# Patient Record
Sex: Female | Born: 1970
Health system: Southern US, Community
[De-identification: ages and names within clinical notes are randomized; demographics above are authoritative.]

## PROBLEM LIST (undated history)

## (undated) DIAGNOSIS — E039 Hypothyroidism, unspecified: Secondary | ICD-10-CM

## (undated) DIAGNOSIS — R87610 Atypical squamous cells of undetermined significance on cytologic smear of cervix (ASC-US): Secondary | ICD-10-CM

## (undated) DIAGNOSIS — E079 Disorder of thyroid, unspecified: Secondary | ICD-10-CM

## (undated) DIAGNOSIS — L709 Acne, unspecified: Secondary | ICD-10-CM

## (undated) DIAGNOSIS — R011 Cardiac murmur, unspecified: Secondary | ICD-10-CM

## (undated) DIAGNOSIS — N870 Mild cervical dysplasia: Secondary | ICD-10-CM

## (undated) HISTORY — PX: WISDOM TOOTH EXTRACTION: SHX21

## (undated) HISTORY — DX: Cardiac murmur, unspecified: R01.1

## (undated) HISTORY — PX: COLPOSCOPY: SHX161

## (undated) HISTORY — DX: Acne, unspecified: L70.9

## (undated) HISTORY — DX: Mild cervical dysplasia: N87.0

## (undated) HISTORY — DX: Hypothyroidism, unspecified: E03.9

## (undated) HISTORY — DX: Disorder of thyroid, unspecified: E07.9

## (undated) HISTORY — DX: Atypical squamous cells of undetermined significance on cytologic smear of cervix (ASC-US): R87.610

---

## 1992-01-10 DIAGNOSIS — N870 Mild cervical dysplasia: Secondary | ICD-10-CM

## 1992-01-10 HISTORY — DX: Mild cervical dysplasia: N87.0

## 1999-01-10 HISTORY — PX: COLPOSCOPY: SHX161

## 1999-03-09 ENCOUNTER — Other Ambulatory Visit: Admission: RE | Admit: 1999-03-09 | Discharge: 1999-03-09 | Payer: Self-pay | Admitting: Obstetrics and Gynecology

## 2000-03-12 ENCOUNTER — Other Ambulatory Visit: Admission: RE | Admit: 2000-03-12 | Discharge: 2000-03-12 | Payer: Self-pay | Admitting: Obstetrics and Gynecology

## 2000-07-06 ENCOUNTER — Ambulatory Visit (HOSPITAL_COMMUNITY): Admission: RE | Admit: 2000-07-06 | Discharge: 2000-07-06 | Payer: Self-pay | Admitting: Obstetrics and Gynecology

## 2000-07-06 ENCOUNTER — Encounter: Payer: Self-pay | Admitting: Obstetrics and Gynecology

## 2001-03-12 ENCOUNTER — Other Ambulatory Visit: Admission: RE | Admit: 2001-03-12 | Discharge: 2001-03-12 | Payer: Self-pay | Admitting: Obstetrics and Gynecology

## 2002-03-24 ENCOUNTER — Other Ambulatory Visit: Admission: RE | Admit: 2002-03-24 | Discharge: 2002-03-24 | Payer: Self-pay | Admitting: Obstetrics and Gynecology

## 2003-03-26 ENCOUNTER — Other Ambulatory Visit: Admission: RE | Admit: 2003-03-26 | Discharge: 2003-03-26 | Payer: Self-pay | Admitting: Obstetrics and Gynecology

## 2004-03-29 ENCOUNTER — Other Ambulatory Visit: Admission: RE | Admit: 2004-03-29 | Discharge: 2004-03-29 | Payer: Self-pay | Admitting: Obstetrics and Gynecology

## 2005-04-19 ENCOUNTER — Other Ambulatory Visit: Admission: RE | Admit: 2005-04-19 | Discharge: 2005-04-19 | Payer: Self-pay | Admitting: Obstetrics and Gynecology

## 2006-04-26 ENCOUNTER — Other Ambulatory Visit: Admission: RE | Admit: 2006-04-26 | Discharge: 2006-04-26 | Payer: Self-pay | Admitting: Obstetrics and Gynecology

## 2007-04-29 ENCOUNTER — Other Ambulatory Visit: Admission: RE | Admit: 2007-04-29 | Discharge: 2007-04-29 | Payer: Self-pay | Admitting: Obstetrics and Gynecology

## 2008-05-04 ENCOUNTER — Ambulatory Visit: Payer: Self-pay | Admitting: Obstetrics and Gynecology

## 2008-05-04 ENCOUNTER — Encounter: Payer: Self-pay | Admitting: Obstetrics and Gynecology

## 2008-05-04 ENCOUNTER — Other Ambulatory Visit: Admission: RE | Admit: 2008-05-04 | Discharge: 2008-05-04 | Payer: Self-pay | Admitting: Obstetrics and Gynecology

## 2009-05-14 ENCOUNTER — Ambulatory Visit: Payer: Self-pay | Admitting: Obstetrics and Gynecology

## 2009-05-14 ENCOUNTER — Other Ambulatory Visit: Admission: RE | Admit: 2009-05-14 | Discharge: 2009-05-14 | Payer: Self-pay | Admitting: Obstetrics and Gynecology

## 2010-03-15 ENCOUNTER — Other Ambulatory Visit (INDEPENDENT_AMBULATORY_CARE_PROVIDER_SITE_OTHER): Payer: Managed Care, Other (non HMO)

## 2010-03-15 DIAGNOSIS — R5383 Other fatigue: Secondary | ICD-10-CM

## 2010-03-15 DIAGNOSIS — E039 Hypothyroidism, unspecified: Secondary | ICD-10-CM

## 2010-03-15 DIAGNOSIS — R5381 Other malaise: Secondary | ICD-10-CM

## 2010-05-16 ENCOUNTER — Encounter (INDEPENDENT_AMBULATORY_CARE_PROVIDER_SITE_OTHER): Payer: Managed Care, Other (non HMO) | Admitting: Obstetrics and Gynecology

## 2010-05-16 ENCOUNTER — Other Ambulatory Visit (HOSPITAL_COMMUNITY)
Admission: RE | Admit: 2010-05-16 | Discharge: 2010-05-16 | Disposition: A | Discharge status: Home / Self Care | Payer: Managed Care, Other (non HMO) | Source: Ambulatory Visit | Attending: Obstetrics and Gynecology | Admitting: Obstetrics and Gynecology

## 2010-05-16 ENCOUNTER — Other Ambulatory Visit: Payer: Self-pay | Admitting: Obstetrics and Gynecology

## 2010-05-16 DIAGNOSIS — Z01419 Encounter for gynecological examination (general) (routine) without abnormal findings: Secondary | ICD-10-CM

## 2010-05-16 DIAGNOSIS — Z13220 Encounter for screening for lipoid disorders: Secondary | ICD-10-CM

## 2010-05-16 DIAGNOSIS — Z124 Encounter for screening for malignant neoplasm of cervix: Secondary | ICD-10-CM | POA: Insufficient documentation

## 2010-05-19 ENCOUNTER — Other Ambulatory Visit: Payer: Managed Care, Other (non HMO)

## 2010-05-19 ENCOUNTER — Ambulatory Visit (INDEPENDENT_AMBULATORY_CARE_PROVIDER_SITE_OTHER): Payer: Managed Care, Other (non HMO) | Admitting: Obstetrics and Gynecology

## 2010-05-19 DIAGNOSIS — R143 Flatulence: Secondary | ICD-10-CM

## 2010-05-19 DIAGNOSIS — N8300 Follicular cyst of ovary, unspecified side: Secondary | ICD-10-CM

## 2010-05-19 DIAGNOSIS — R141 Gas pain: Secondary | ICD-10-CM

## 2010-05-19 DIAGNOSIS — N949 Unspecified condition associated with female genital organs and menstrual cycle: Secondary | ICD-10-CM

## 2010-10-21 ENCOUNTER — Other Ambulatory Visit: Payer: Self-pay | Admitting: Obstetrics and Gynecology

## 2010-10-21 DIAGNOSIS — Z1231 Encounter for screening mammogram for malignant neoplasm of breast: Secondary | ICD-10-CM

## 2010-11-15 ENCOUNTER — Ambulatory Visit (HOSPITAL_COMMUNITY)
Admission: RE | Admit: 2010-11-15 | Discharge: 2010-11-15 | Disposition: A | Discharge status: Home / Self Care | Payer: Managed Care, Other (non HMO) | Source: Ambulatory Visit | Attending: Obstetrics and Gynecology | Admitting: Obstetrics and Gynecology

## 2010-11-15 DIAGNOSIS — Z1231 Encounter for screening mammogram for malignant neoplasm of breast: Secondary | ICD-10-CM | POA: Insufficient documentation

## 2011-05-10 ENCOUNTER — Encounter: Payer: Self-pay | Admitting: Gynecology

## 2011-05-10 DIAGNOSIS — R011 Cardiac murmur, unspecified: Secondary | ICD-10-CM | POA: Insufficient documentation

## 2011-05-23 ENCOUNTER — Encounter: Payer: Self-pay | Admitting: Obstetrics and Gynecology

## 2011-05-23 ENCOUNTER — Ambulatory Visit (INDEPENDENT_AMBULATORY_CARE_PROVIDER_SITE_OTHER): Payer: Managed Care, Other (non HMO) | Admitting: Obstetrics and Gynecology

## 2011-05-23 VITALS — BP 116/70 | Ht 64.0 in | Wt 127.0 lb

## 2011-05-23 DIAGNOSIS — Z01419 Encounter for gynecological examination (general) (routine) without abnormal findings: Secondary | ICD-10-CM

## 2011-05-23 DIAGNOSIS — E039 Hypothyroidism, unspecified: Secondary | ICD-10-CM

## 2011-05-23 MED ORDER — LEVOTHYROXINE SODIUM 125 MCG PO TABS: 125.0000 ug | ORAL_TABLET | Freq: Every day | ORAL | Status: DC

## 2011-05-23 NOTE — Progress Notes (Signed)
Patient came to see me today for her annual GYN exam. She is up-to-date on mammograms. Her cycles are fine. She continues not to use contraception and if she conceives it'll be okay. She does have a history of infertility. She is doing well on her Synthroid. She is having no abnormal bleeding. She is having no pelvic pain or dyspareunia. She did wellness lab  at work and will have it sent to [place]. 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  Physical examination: Kennon Portela present. HEENT within normal limits. Neck: Thyroid not large. No masses. Supraclavicular nodes: not enlarged. Breasts: Examined in both sitting and lying  position. No skin changes and no masses. Abdomen: Soft no guarding rebound or masses or hernia. Pelvic: External: Within normal limits. BUS: Within normal limits. Vaginal:within normal limits. Good estrogen effect. No evidence of cystocele rectocele or enterocele. Cervix: clean. Uterus: Normal size and shape. Adnexa: No masses. Rectovaginal exam: Confirmatory and negative. Extremities: Within normal limits.  Assessment: #1. Hypothyroidism #2. CIN-1  Plan: TSH checked. Continue Synthroid.

## 2011-05-24 LAB — URINALYSIS W MICROSCOPIC + REFLEX CULTURE
Bacteria, UA: NONE SEEN
Bilirubin Urine: NEGATIVE
Casts: NONE SEEN
Crystals: NONE SEEN
Glucose, UA: NEGATIVE mg/dL
Hgb urine dipstick: NEGATIVE
Ketones, ur: NEGATIVE mg/dL
Leukocytes, UA: NEGATIVE
Nitrite: NEGATIVE
Protein, ur: NEGATIVE mg/dL
Specific Gravity, Urine: 1.009 (ref 1.005–1.030)
Squamous Epithelial / HPF: NONE SEEN
Urobilinogen, UA: 0.2 mg/dL (ref 0.0–1.0)
pH: 6 (ref 5.0–8.0)

## 2012-03-21 ENCOUNTER — Other Ambulatory Visit (HOSPITAL_COMMUNITY): Payer: Self-pay | Admitting: Family Medicine

## 2012-03-21 ENCOUNTER — Other Ambulatory Visit (HOSPITAL_COMMUNITY): Payer: Managed Care, Other (non HMO)

## 2012-03-21 DIAGNOSIS — I369 Nonrheumatic tricuspid valve disorder, unspecified: Secondary | ICD-10-CM

## 2012-03-25 ENCOUNTER — Other Ambulatory Visit (HOSPITAL_COMMUNITY): Payer: Managed Care, Other (non HMO)

## 2012-04-15 ENCOUNTER — Ambulatory Visit (HOSPITAL_COMMUNITY): Payer: Managed Care, Other (non HMO) | Attending: Cardiovascular Disease

## 2012-04-15 DIAGNOSIS — I369 Nonrheumatic tricuspid valve disorder, unspecified: Secondary | ICD-10-CM | POA: Insufficient documentation

## 2012-04-15 NOTE — Progress Notes (Signed)
Echocardiogram performed.  

## 2012-04-16 ENCOUNTER — Encounter (HOSPITAL_COMMUNITY): Payer: Self-pay | Admitting: Family Medicine

## 2012-05-24 ENCOUNTER — Encounter: Payer: Self-pay | Admitting: Gynecology

## 2012-05-30 ENCOUNTER — Other Ambulatory Visit: Payer: Self-pay | Admitting: Obstetrics and Gynecology

## 2012-06-12 ENCOUNTER — Other Ambulatory Visit (HOSPITAL_COMMUNITY)
Admission: RE | Admit: 2012-06-12 | Discharge: 2012-06-12 | Disposition: A | Discharge status: Home / Self Care | Payer: Managed Care, Other (non HMO) | Source: Ambulatory Visit | Attending: Gynecology | Admitting: Gynecology

## 2012-06-12 ENCOUNTER — Encounter: Payer: Self-pay | Admitting: Gynecology

## 2012-06-12 ENCOUNTER — Encounter: Payer: Self-pay | Admitting: Obstetrics and Gynecology

## 2012-06-12 ENCOUNTER — Ambulatory Visit (INDEPENDENT_AMBULATORY_CARE_PROVIDER_SITE_OTHER): Payer: Managed Care, Other (non HMO) | Admitting: Gynecology

## 2012-06-12 VITALS — BP 114/70 | Ht 64.25 in | Wt 128.0 lb

## 2012-06-12 DIAGNOSIS — Z01419 Encounter for gynecological examination (general) (routine) without abnormal findings: Secondary | ICD-10-CM | POA: Insufficient documentation

## 2012-06-12 DIAGNOSIS — E039 Hypothyroidism, unspecified: Secondary | ICD-10-CM

## 2012-06-12 LAB — TSH: TSH: 1.103 u[IU]/mL (ref 0.350–4.500)

## 2012-06-12 MED ORDER — LEVOTHYROXINE SODIUM 125 MCG PO TABS: ORAL_TABLET | ORAL | Status: DC

## 2012-06-12 NOTE — Addendum Note (Signed)
Addended by: Richardson Chiquito on: 06/12/2012 09:53 AM   Modules accepted: Orders

## 2012-06-12 NOTE — Addendum Note (Signed)
Addended by: Bertram Savin A on: 06/12/2012 10:22 AM   Modules accepted: Orders

## 2012-06-12 NOTE — Patient Instructions (Addendum)
Call to Schedule your mammogram  Facilities in [place]: 1)  [facility], [address] Phone: [phone] 2)  [facility], [address] Phone: [phone] 3)  Dr. Bertrand at [facility]  [address] Phone: [phone]     Mammogram A mammogram is an X-ray test to find changes in a woman's breast. You should get a mammogram if:  You are 40 years of age or older  You have risk factors.   Your doctor recommends that you have one.  BEFORE THE TEST  Do not schedule the test the week before your period, especially if your breasts are sore during this time.  On the day of your mammogram:  Wash your breasts and armpits well. After washing, do not put on any deodorant or talcum powder on until after your test.   Eat and drink as you usually do.   Take your medicines as usual.   If you are diabetic and take insulin, make sure you:   Eat before coming for your test.   Take your insulin as usual.   If you cannot keep your appointment, call before the appointment to cancel. Schedule another appointment.  TEST  You will need to undress from the waist up. You will put on a hospital gown.   Your breast will be put on the mammogram machine, and it will press firmly on your breast with a piece of plastic called a compression paddle. This will make your breast flatter so that the machine can X-ray all parts of your breast.   Both breasts will be X-rayed. Each breast will be X-rayed from above and from the side. An X-ray might need to be taken again if the picture is not good enough.   The mammogram will last about 15 to 30 minutes.  AFTER THE TEST Finding out the results of your test Ask when your test results will be ready. Make sure you get your test results.  Document Released: 03/24/2008 Document Revised: 12/15/2010 Document Reviewed: 03/24/2008 Exit[employer] Patient  Information 2012 [employer].   

## 2012-06-12 NOTE — Progress Notes (Signed)
Madison [name] [facility] 12-08-1970 [id number]        42 y.o.  G0P0 for annual exam.  Former patient of Dr. Eda [name] Doing well without complaints.  Past medical history,surgical history, medications, allergies, family history and social history were all reviewed and documented in the EPIC chart.  ROS:  Performed and pertinent positives and negatives are included in the history, assessment and plan .  Exam: Madison [name] assistant Filed Vitals:   06/12/12 0914  BP: 114/70  Height: 5' 4.25" (1.632 m)  Weight: 128 lb (58.06 kg)   General appearance  Normal Skin grossly normal Head/Neck normal with no cervical or supraclavicular adenopathy thyroid normal Lungs  clear Cardiac RR, without RMG Abdominal  soft, nontender, without masses, organomegaly or hernia Breasts  examined lying and sitting without masses, retractions, discharge or axillary adenopathy. Pelvic  Ext/BUS/vagina  normal   Cervix  normal Pap done  Uterus  axial, normal size, shape and contour, midline and mobile nontender   Adnexa  Without masses or tenderness    Anus and perineum  normal   Rectovaginal  normal sphincter tone without palpated masses or tenderness.    Assessment/Plan:  42 y.o. G0P0 female for annual exam, regular menses no contraception.   1. Contraception. Patient has history of infertility and does not use contraception. Understands and accepts the pregnancy risk. Is on a multivitamin with folic acid. 2. Hypothyroid. On Synthroid 125 mcg. Check TSH, refill Synthroid x1 year. 3. Mammography 2012. Patient overdue and she knows to schedule. SBE monthly reviewed. 4. Pap smear 2012. Pap done today at her request. History of CIN-1 1994. Normal Pap smears since then. I reviewed screening guidelines options for every 3-5 year screening reviewed. Patient's uncomfortable with this and requests Pap smear today. 5. Health maintenance. Patient actively sees Dr. Doristine [name] and has routine blood work done through his office.  Followup one year, sooner as needed.    Madison Lords MD, 9:37 AM 06/12/2012

## 2012-09-17 ENCOUNTER — Other Ambulatory Visit: Payer: Self-pay | Admitting: Gynecology

## 2012-09-17 DIAGNOSIS — Z1231 Encounter for screening mammogram for malignant neoplasm of breast: Secondary | ICD-10-CM

## 2012-10-04 ENCOUNTER — Other Ambulatory Visit: Payer: Self-pay | Admitting: Gynecology

## 2012-10-04 ENCOUNTER — Ambulatory Visit (HOSPITAL_COMMUNITY)
Admission: RE | Admit: 2012-10-04 | Discharge: 2012-10-04 | Disposition: A | Discharge status: Home / Self Care | Payer: Managed Care, Other (non HMO) | Source: Ambulatory Visit | Attending: Gynecology | Admitting: Gynecology

## 2012-10-04 DIAGNOSIS — Z1231 Encounter for screening mammogram for malignant neoplasm of breast: Secondary | ICD-10-CM

## 2013-04-02 ENCOUNTER — Telehealth: Payer: Self-pay | Admitting: *Deleted

## 2013-04-02 DIAGNOSIS — E039 Hypothyroidism, unspecified: Secondary | ICD-10-CM

## 2013-04-02 NOTE — Telephone Encounter (Signed)
Pt called requesting if she could come by to have TSH level checked, last checked in June 2014. Please advise

## 2013-04-02 NOTE — Telephone Encounter (Signed)
Pt informed order placed. 

## 2013-04-02 NOTE — Telephone Encounter (Signed)
She certainly can if she is having symptoms that she's wondering if her thyroid dose is correct. She is due for her annual in June and I would routinely do it at that time also but if she wants to come in earlier that's okay

## 2013-04-03 ENCOUNTER — Other Ambulatory Visit: Payer: Managed Care, Other (non HMO)

## 2013-04-03 DIAGNOSIS — E039 Hypothyroidism, unspecified: Secondary | ICD-10-CM

## 2013-04-03 LAB — TSH: TSH: 0.769 u[IU]/mL (ref 0.350–4.500)

## 2013-04-07 ENCOUNTER — Telehealth: Payer: Self-pay | Admitting: *Deleted

## 2013-04-07 NOTE — Telephone Encounter (Signed)
Pt informed with TSH level results on 04/04/13.

## 2013-06-16 ENCOUNTER — Encounter: Payer: Managed Care, Other (non HMO) | Admitting: Gynecology

## 2013-06-17 ENCOUNTER — Ambulatory Visit (INDEPENDENT_AMBULATORY_CARE_PROVIDER_SITE_OTHER): Payer: Managed Care, Other (non HMO) | Admitting: Gynecology

## 2013-06-17 ENCOUNTER — Encounter: Payer: Self-pay | Admitting: Gynecology

## 2013-06-17 VITALS — BP 120/70 | Ht 64.0 in | Wt 127.0 lb

## 2013-06-17 DIAGNOSIS — E039 Hypothyroidism, unspecified: Secondary | ICD-10-CM

## 2013-06-17 DIAGNOSIS — Z01419 Encounter for gynecological examination (general) (routine) without abnormal findings: Secondary | ICD-10-CM

## 2013-06-17 MED ORDER — LEVOTHYROXINE SODIUM 125 MCG PO TABS: ORAL_TABLET | ORAL | Status: DC

## 2013-06-17 NOTE — Progress Notes (Signed)
Madison [name] [facility] 11-19-1970 [id number]        43 y.o.  G0P0 for annual exam.  Doing well. Several issues reviewed below  Past medical history,surgical history, problem list, medications, allergies, family history and social history were all reviewed and documented as reviewed in the EPIC chart.  ROS:  12 system ROS performed with pertinent positives and negatives included in the history, assessment and plan.  Included Systems: General, HEENT, Neck, Cardiovascular, Pulmonary, Gastrointestinal, Genitourinary, Musculoskeletal, Dermatologic, Endocrine, Hematological, Neurologic, Psychiatric Additional significant findings :  None   Exam: Kim assistant Filed Vitals:   06/17/13 0830  BP: 120/70  Height: 5\' 4"  (1.626 m)  Weight: 127 lb (57.607 kg)   General appearance:  Normal affect, orientation and appearance. Skin: Grossly normal HEENT: Without gross lesions.  No cervical or supraclavicular adenopathy. Thyroid normal.  Lungs:  Clear without wheezing, rales or rhonchi Cardiac: RR, without RMG Abdominal:  Soft, nontender, without masses, guarding, rebound, organomegaly or hernia Breasts:  Examined lying and sitting without masses, retractions, discharge or axillary adenopathy. Pelvic:  Ext/BUS/vagina normal  Cervix normal  Uterus anteverted, normal size, shape and contour, midline and mobile nontender   Adnexa  Without masses or tenderness    Anus and perineum  Normal   Rectovaginal  Normal sphincter tone without palpated masses or tenderness.    Assessment/Plan:  43 y.o. G0P0 female for annual exam with regular menses, no contraception.   1. Contraception. Patient continues with no contraception. History of infertility. Would accept pregnancy occurs. Is on a multivitamin with folic acid. 2. Hypothyroid. TSH normal March 2015. Refill Synthroid 125 mcg x1 year 3. Pap smear 2014 normal. No Pap smear done today. History CIN-1 1994 with normal Pap smears since then. Plan repeat Pap smear  in 3 year interval. 4. Mammography 09/2012. Continue with annual mammography. 5. Health maintenance. Recently had routine blood work at her primary physician's office. No blood work done today. Followup in one year, sooner as needed.   Note: This document was prepared with digital dictation and possible smart phrase technology. Any transcriptional errors that result from this process are unintentional.   Dara Lords MD, 8:55 AM 06/17/2013

## 2013-06-17 NOTE — Patient Instructions (Signed)
Followup in one year for annual exam, sooner if any issues.  You may obtain a copy of any labs that were done today by logging onto MyChart as outlined in the instructions provided with your AVS (after visit summary). The office will not call with normal lab results but certainly if there are any significant abnormalities then we will contact you.   Health Maintenance, Female A healthy lifestyle and preventative care can promote health and wellness.  Maintain regular health, dental, and eye exams.  Eat a healthy diet. Foods like vegetables, fruits, whole grains, low-fat dairy products, and lean protein foods contain the nutrients you need without too many calories. Decrease your intake of foods high in solid fats, added sugars, and salt. Get information about a proper diet from your caregiver, if necessary.  Regular physical exercise is one of the most important things you can do for your health. Most adults should get at least 150 minutes of moderate-intensity exercise (any activity that increases your heart rate and causes you to sweat) each week. In addition, most adults need muscle-strengthening exercises on 2 or more days a week.   Maintain a healthy weight. The body mass index (BMI) is a screening tool to identify possible weight problems. It provides an estimate of body fat based on height and weight. Your caregiver can help determine your BMI, and can help you achieve or maintain a healthy weight. For adults 20 years and older:  A BMI below 18.5 is considered underweight.  A BMI of 18.5 to 24.9 is normal.  A BMI of 25 to 29.9 is considered overweight.  A BMI of 30 and above is considered obese.  Maintain normal blood lipids and cholesterol by exercising and minimizing your intake of saturated fat. Eat a balanced diet with plenty of fruits and vegetables. Blood tests for lipids and cholesterol should begin at age 20 and be repeated every 5 years. If your lipid or cholesterol levels are  high, you are over 50, or you are a high risk for heart disease, you may need your cholesterol levels checked more frequently.Ongoing high lipid and cholesterol levels should be treated with medicines if diet and exercise are not effective.  If you smoke, find out from your caregiver how to quit. If you do not use tobacco, do not start.  Lung cancer screening is recommended for adults aged 55 80 years who are at high risk for developing lung cancer because of a history of smoking. Yearly low-dose computed tomography (CT) is recommended for people who have at least a 30-pack-year history of smoking and are a current smoker or have quit within the past 15 years. A pack year of smoking is smoking an average of 1 pack of cigarettes a day for 1 year (for example: 1 pack a day for 30 years or 2 packs a day for 15 years). Yearly screening should continue until the smoker has stopped smoking for at least 15 years. Yearly screening should also be stopped for people who develop a health problem that would prevent them from having lung cancer treatment.  If you are pregnant, do not drink alcohol. If you are breastfeeding, be very cautious about drinking alcohol. If you are not pregnant and choose to drink alcohol, do not exceed 1 drink per day. One drink is considered to be 12 ounces (355 mL) of beer, 5 ounces (148 mL) of wine, or 1.5 ounces (44 mL) of liquor.  Avoid use of street drugs. Do not share needles with   anyone. Ask for help if you need support or instructions about stopping the use of drugs.  High blood pressure causes heart disease and increases the risk of stroke. Blood pressure should be checked at least every 1 to 2 years. Ongoing high blood pressure should be treated with medicines, if weight loss and exercise are not effective.  If you are 55 to 43 years old, ask your caregiver if you should take aspirin to prevent strokes.  Diabetes screening involves taking a blood sample to check your fasting  blood sugar level. This should be done once every 3 years, after age 45, if you are within normal weight and without risk factors for diabetes. Testing should be considered at a younger age or be carried out more frequently if you are overweight and have at least 1 risk factor for diabetes.  Breast cancer screening is essential preventative care for women. You should practice "breast self-awareness." This means understanding the normal appearance and feel of your breasts and may include breast self-examination. Any changes detected, no matter how small, should be reported to a caregiver. Women in their 20s and 30s should have a clinical breast exam (CBE) by a caregiver as part of a regular health exam every 1 to 3 years. After age 40, women should have a CBE every year. Starting at age 40, women should consider having a mammogram (breast X-ray) every year. Women who have a family history of breast cancer should talk to their caregiver about genetic screening. Women at a high risk of breast cancer should talk to their caregiver about having an MRI and a mammogram every year.  Breast cancer gene (BRCA)-related cancer risk assessment is recommended for women who have family members with BRCA-related cancers. BRCA-related cancers include breast, ovarian, tubal, and peritoneal cancers. Having family members with these cancers may be associated with an increased risk for harmful changes (mutations) in the breast cancer genes BRCA1 and BRCA2. Results of the assessment will determine the need for genetic counseling and BRCA1 and BRCA2 testing.  The Pap test is a screening test for cervical cancer. Women should have a Pap test starting at age 21. Between ages 21 and 29, Pap tests should be repeated every 2 years. Beginning at age 30, you should have a Pap test every 3 years as long as the past 3 Pap tests have been normal. If you had a hysterectomy for a problem that was not cancer or a condition that could lead to  cancer, then you no longer need Pap tests. If you are between ages 65 and 70, and you have had normal Pap tests going back 10 years, you no longer need Pap tests. If you have had past treatment for cervical cancer or a condition that could lead to cancer, you need Pap tests and screening for cancer for at least 20 years after your treatment. If Pap tests have been discontinued, risk factors (such as a new sexual partner) need to be reassessed to determine if screening should be resumed. Some women have medical problems that increase the chance of getting cervical cancer. In these cases, your caregiver may recommend more frequent screening and Pap tests.  The human papillomavirus (HPV) test is an additional test that may be used for cervical cancer screening. The HPV test looks for the virus that can cause the cell changes on the cervix. The cells collected during the Pap test can be tested for HPV. The HPV test could be used to screen women aged 30   years and older, and should be used in women of any age who have unclear Pap test results. After the age of 30, women should have HPV testing at the same frequency as a Pap test.  Colorectal cancer can be detected and often prevented. Most routine colorectal cancer screening begins at the age of 50 and continues through age 75. However, your caregiver may recommend screening at an earlier age if you have risk factors for colon cancer. On a yearly basis, your caregiver may provide home test kits to check for hidden blood in the stool. Use of a small camera at the end of a tube, to directly examine the colon (sigmoidoscopy or colonoscopy), can detect the earliest forms of colorectal cancer. Talk to your caregiver about this at age 50, when routine screening begins. Direct examination of the colon should be repeated every 5 to 10 years through age 75, unless early forms of pre-cancerous polyps or small growths are found.  Hepatitis C blood testing is recommended for  all people born from 1945 through 1965 and any individual with known risks for hepatitis C.  Practice safe sex. Use condoms and avoid high-risk sexual practices to reduce the spread of sexually transmitted infections (STIs). Sexually active women aged 25 and younger should be checked for Chlamydia, which is a common sexually transmitted infection. Older women with new or multiple partners should also be tested for Chlamydia. Testing for other STIs is recommended if you are sexually active and at increased risk.  Osteoporosis is a disease in which the bones lose minerals and strength with aging. This can result in serious bone fractures. The risk of osteoporosis can be identified using a bone density scan. Women ages 65 and over and women at risk for fractures or osteoporosis should discuss screening with their caregivers. Ask your caregiver whether you should be taking a calcium supplement or vitamin D to reduce the rate of osteoporosis.  Menopause can be associated with physical symptoms and risks. Hormone replacement therapy is available to decrease symptoms and risks. You should talk to your caregiver about whether hormone replacement therapy is right for you.  Use sunscreen. Apply sunscreen liberally and repeatedly throughout the day. You should seek shade when your shadow is shorter than you. Protect yourself by wearing long sleeves, pants, a wide-brimmed hat, and sunglasses year round, whenever you are outdoors.  Notify your caregiver of new moles or changes in moles, especially if there is a change in shape or color. Also notify your caregiver if a mole is larger than the size of a pencil eraser.  Stay current with your immunizations. Document Released: 07/11/2010 Document Revised: 04/22/2012 Document Reviewed: 07/11/2010 [employer] Patient Information 2014 [employer].   

## 2013-06-18 LAB — URINALYSIS W MICROSCOPIC + REFLEX CULTURE
Bilirubin Urine: NEGATIVE
CASTS: NONE SEEN
Crystals: NONE SEEN
GLUCOSE, UA: NEGATIVE mg/dL
HGB URINE DIPSTICK: NEGATIVE
KETONES UR: NEGATIVE mg/dL
Leukocytes, UA: NEGATIVE
Nitrite: NEGATIVE
PH: 6.5 (ref 5.0–8.0)
PROTEIN: NEGATIVE mg/dL
Specific Gravity, Urine: 1.017 (ref 1.005–1.030)
Squamous Epithelial / LPF: NONE SEEN
Urobilinogen, UA: 0.2 mg/dL (ref 0.0–1.0)

## 2013-09-16 ENCOUNTER — Other Ambulatory Visit: Payer: Self-pay | Admitting: Gynecology

## 2013-09-16 DIAGNOSIS — Z1231 Encounter for screening mammogram for malignant neoplasm of breast: Secondary | ICD-10-CM

## 2013-10-08 ENCOUNTER — Ambulatory Visit (HOSPITAL_COMMUNITY)
Admission: RE | Admit: 2013-10-08 | Discharge: 2013-10-08 | Disposition: A | Discharge status: Home / Self Care | Payer: Managed Care, Other (non HMO) | Source: Ambulatory Visit | Attending: Gynecology | Admitting: Gynecology

## 2013-10-08 DIAGNOSIS — Z1231 Encounter for screening mammogram for malignant neoplasm of breast: Secondary | ICD-10-CM | POA: Diagnosis present

## 2014-03-18 ENCOUNTER — Other Ambulatory Visit: Payer: Self-pay | Admitting: Family Medicine

## 2014-06-27 ENCOUNTER — Other Ambulatory Visit: Payer: Self-pay | Admitting: Gynecology

## 2014-07-10 ENCOUNTER — Encounter: Payer: Managed Care, Other (non HMO) | Admitting: Gynecology

## 2014-07-17 ENCOUNTER — Ambulatory Visit (INDEPENDENT_AMBULATORY_CARE_PROVIDER_SITE_OTHER): Payer: Managed Care, Other (non HMO) | Admitting: Gynecology

## 2014-07-17 ENCOUNTER — Encounter: Payer: Self-pay | Admitting: Gynecology

## 2014-07-17 VITALS — BP 112/64 | Ht 64.5 in | Wt 125.0 lb

## 2014-07-17 DIAGNOSIS — Z01419 Encounter for gynecological examination (general) (routine) without abnormal findings: Secondary | ICD-10-CM

## 2014-07-17 LAB — COMPREHENSIVE METABOLIC PANEL
ALT: 13 U/L (ref 0–35)
AST: 15 U/L (ref 0–37)
Albumin: 4.5 g/dL (ref 3.5–5.2)
Alkaline Phosphatase: 25 U/L — ABNORMAL LOW (ref 39–117)
BILIRUBIN TOTAL: 0.5 mg/dL (ref 0.2–1.2)
BUN: 10 mg/dL (ref 6–23)
CALCIUM: 8.9 mg/dL (ref 8.4–10.5)
CO2: 26 mEq/L (ref 19–32)
Chloride: 103 mEq/L (ref 96–112)
Creat: 0.61 mg/dL (ref 0.50–1.10)
GLUCOSE: 85 mg/dL (ref 70–99)
Potassium: 4 mEq/L (ref 3.5–5.3)
Sodium: 138 mEq/L (ref 135–145)
Total Protein: 6.9 g/dL (ref 6.0–8.3)

## 2014-07-17 LAB — URINALYSIS W MICROSCOPIC + REFLEX CULTURE
Bacteria, UA: NONE SEEN
Bilirubin Urine: NEGATIVE
CRYSTALS: NONE SEEN
Casts: NONE SEEN
Glucose, UA: NEGATIVE mg/dL
HGB URINE DIPSTICK: NEGATIVE
Ketones, ur: NEGATIVE mg/dL
LEUKOCYTES UA: NEGATIVE
NITRITE: NEGATIVE
PH: 6 (ref 5.0–8.0)
Protein, ur: NEGATIVE mg/dL
SPECIFIC GRAVITY, URINE: 1.013 (ref 1.005–1.030)
SQUAMOUS EPITHELIAL / LPF: NONE SEEN
Urobilinogen, UA: 0.2 mg/dL (ref 0.0–1.0)

## 2014-07-17 LAB — CBC WITH DIFFERENTIAL/PLATELET
Basophils Absolute: 0 10*3/uL (ref 0.0–0.1)
Basophils Relative: 1 % (ref 0–1)
EOS ABS: 0.2 10*3/uL (ref 0.0–0.7)
EOS PCT: 5 % (ref 0–5)
HEMATOCRIT: 38.8 % (ref 36.0–46.0)
HEMOGLOBIN: 13 g/dL (ref 12.0–15.0)
LYMPHS ABS: 1.4 10*3/uL (ref 0.7–4.0)
Lymphocytes Relative: 36 % (ref 12–46)
MCH: 32.2 pg (ref 26.0–34.0)
MCHC: 33.5 g/dL (ref 30.0–36.0)
MCV: 96 fL (ref 78.0–100.0)
MPV: 9.9 fL (ref 8.6–12.4)
Monocytes Absolute: 0.3 10*3/uL (ref 0.1–1.0)
Monocytes Relative: 7 % (ref 3–12)
Neutro Abs: 2 10*3/uL (ref 1.7–7.7)
Neutrophils Relative %: 51 % (ref 43–77)
Platelets: 257 10*3/uL (ref 150–400)
RBC: 4.04 MIL/uL (ref 3.87–5.11)
RDW: 13.9 % (ref 11.5–15.5)
WBC: 3.9 10*3/uL — ABNORMAL LOW (ref 4.0–10.5)

## 2014-07-17 LAB — LIPID PANEL
CHOLESTEROL: 172 mg/dL (ref 0–200)
HDL: 69 mg/dL (ref >=46)
LDL Cholesterol: 95 mg/dL (ref 0–99)
TRIGLYCERIDES: 41 mg/dL (ref <150)
Total CHOL/HDL Ratio: 2.5 Ratio
VLDL: 8 mg/dL (ref 0–40)

## 2014-07-17 LAB — TSH: TSH: 2.699 u[IU]/mL (ref 0.350–4.500)

## 2014-07-17 MED ORDER — LEVOTHYROXINE SODIUM 125 MCG PO TABS: 125.0000 ug | ORAL_TABLET | Freq: Every day | ORAL | Status: DC

## 2014-07-17 NOTE — Patient Instructions (Signed)
You may obtain a copy of any labs that were done today by logging onto MyChart as outlined in the instructions provided with your AVS (after visit summary). The office will not call with normal lab results but certainly if there are any significant abnormalities then we will contact you.   Health Maintenance, Female A healthy lifestyle and preventative care can promote health and wellness.  Maintain regular health, dental, and eye exams.  Eat a healthy diet. Foods like vegetables, fruits, whole grains, low-fat dairy products, and lean protein foods contain the nutrients you need without too many calories. Decrease your intake of foods high in solid fats, added sugars, and salt. Get information about a proper diet from your caregiver, if necessary.  Regular physical exercise is one of the most important things you can do for your health. Most adults should get at least 150 minutes of moderate-intensity exercise (any activity that increases your heart rate and causes you to sweat) each week. In addition, most adults need muscle-strengthening exercises on 2 or more days a week.   Maintain a healthy weight. The body mass index (BMI) is a screening tool to identify possible weight problems. It provides an estimate of body fat based on height and weight. Your caregiver can help determine your BMI, and can help you achieve or maintain a healthy weight. For adults 20 years and older:  A BMI below 18.5 is considered underweight.  A BMI of 18.5 to 24.9 is normal.  A BMI of 25 to 29.9 is considered overweight.  A BMI of 30 and above is considered obese.  Maintain normal blood lipids and cholesterol by exercising and minimizing your intake of saturated fat. Eat a balanced diet with plenty of fruits and vegetables. Blood tests for lipids and cholesterol should begin at age 61 and be repeated every 5 years. If your lipid or cholesterol levels are high, you are over 50, or you are a high risk for heart  disease, you may need your cholesterol levels checked more frequently.Ongoing high lipid and cholesterol levels should be treated with medicines if diet and exercise are not effective.  If you smoke, find out from your caregiver how to quit. If you do not use tobacco, do not start.  Lung cancer screening is recommended for adults aged 33 80 years who are at high risk for developing lung cancer because of a history of smoking. Yearly low-dose computed tomography (CT) is recommended for people who have at least a 30-pack-year history of smoking and are a current smoker or have quit within the past 15 years. A pack year of smoking is smoking an average of 1 pack of cigarettes a day for 1 year (for example: 1 pack a day for 30 years or 2 packs a day for 15 years). Yearly screening should continue until the smoker has stopped smoking for at least 15 years. Yearly screening should also be stopped for people who develop a health problem that would prevent them from having lung cancer treatment.  If you are pregnant, do not drink alcohol. If you are breastfeeding, be very cautious about drinking alcohol. If you are not pregnant and choose to drink alcohol, do not exceed 1 drink per day. One drink is considered to be 12 ounces (355 mL) of beer, 5 ounces (148 mL) of wine, or 1.5 ounces (44 mL) of liquor.  Avoid use of street drugs. Do not share needles with anyone. Ask for help if you need support or instructions about stopping  the use of drugs.  High blood pressure causes heart disease and increases the risk of stroke. Blood pressure should be checked at least every 1 to 2 years. Ongoing high blood pressure should be treated with medicines, if weight loss and exercise are not effective.  If you are 59 to 44 years old, ask your caregiver if you should take aspirin to prevent strokes.  Diabetes screening involves taking a blood sample to check your fasting blood sugar level. This should be done once every 3  years, after age 91, if you are within normal weight and without risk factors for diabetes. Testing should be considered at a younger age or be carried out more frequently if you are overweight and have at least 1 risk factor for diabetes.  Breast cancer screening is essential preventative care for women. You should practice "breast self-awareness." This means understanding the normal appearance and feel of your breasts and may include breast self-examination. Any changes detected, no matter how small, should be reported to a caregiver. Women in their 66s and 30s should have a clinical breast exam (CBE) by a caregiver as part of a regular health exam every 1 to 3 years. After age 101, women should have a CBE every year. Starting at age 100, women should consider having a mammogram (breast X-ray) every year. Women who have a family history of breast cancer should talk to their caregiver about genetic screening. Women at a high risk of breast cancer should talk to their caregiver about having an MRI and a mammogram every year.  Breast cancer gene (BRCA)-related cancer risk assessment is recommended for women who have family members with BRCA-related cancers. BRCA-related cancers include breast, ovarian, tubal, and peritoneal cancers. Having family members with these cancers may be associated with an increased risk for harmful changes (mutations) in the breast cancer genes BRCA1 and BRCA2. Results of the assessment will determine the need for genetic counseling and BRCA1 and BRCA2 testing.  The Pap test is a screening test for cervical cancer. Women should have a Pap test starting at age 57. Between ages 25 and 35, Pap tests should be repeated every 2 years. Beginning at age 37, you should have a Pap test every 3 years as long as the past 3 Pap tests have been normal. If you had a hysterectomy for a problem that was not cancer or a condition that could lead to cancer, then you no longer need Pap tests. If you are  between ages 50 and 76, and you have had normal Pap tests going back 10 years, you no longer need Pap tests. If you have had past treatment for cervical cancer or a condition that could lead to cancer, you need Pap tests and screening for cancer for at least 20 years after your treatment. If Pap tests have been discontinued, risk factors (such as a new sexual partner) need to be reassessed to determine if screening should be resumed. Some women have medical problems that increase the chance of getting cervical cancer. In these cases, your caregiver may recommend more frequent screening and Pap tests.  The human papillomavirus (HPV) test is an additional test that may be used for cervical cancer screening. The HPV test looks for the virus that can cause the cell changes on the cervix. The cells collected during the Pap test can be tested for HPV. The HPV test could be used to screen women aged 44 years and older, and should be used in women of any age  who have unclear Pap test results. After the age of 55, women should have HPV testing at the same frequency as a Pap test.  Colorectal cancer can be detected and often prevented. Most routine colorectal cancer screening begins at the age of 44 and continues through age 20. However, your caregiver may recommend screening at an earlier age if you have risk factors for colon cancer. On a yearly basis, your caregiver may provide home test kits to check for hidden blood in the stool. Use of a small camera at the end of a tube, to directly examine the colon (sigmoidoscopy or colonoscopy), can detect the earliest forms of colorectal cancer. Talk to your caregiver about this at age 86, when routine screening begins. Direct examination of the colon should be repeated every 5 to 10 years through age 13, unless early forms of pre-cancerous polyps or small growths are found.  Hepatitis C blood testing is recommended for all people born from [birth year] through 1965 and any  individual with known risks for hepatitis C.  Practice safe sex. Use condoms and avoid high-risk sexual practices to reduce the spread of sexually transmitted infections (STIs). Sexually active women aged 36 and younger should be checked for Chlamydia, which is a common sexually transmitted infection. Older women with new or multiple partners should also be tested for Chlamydia. Testing for other STIs is recommended if you are sexually active and at increased risk.  Osteoporosis is a disease in which the bones lose minerals and strength with aging. This can result in serious bone fractures. The risk of osteoporosis can be identified using a bone density scan. Women ages 20 and over and women at risk for fractures or osteoporosis should discuss screening with their caregivers. Ask your caregiver whether you should be taking a calcium supplement or vitamin D to reduce the rate of osteoporosis.  Menopause can be associated with physical symptoms and risks. Hormone replacement therapy is available to decrease symptoms and risks. You should talk to your caregiver about whether hormone replacement therapy is right for you.  Use sunscreen. Apply sunscreen liberally and repeatedly throughout the day. You should seek shade when your shadow is shorter than you. Protect yourself by wearing long sleeves, pants, a wide-brimmed hat, and sunglasses year round, whenever you are outdoors.  Notify your caregiver of new moles or changes in moles, especially if there is a change in shape or color. Also notify your caregiver if a mole is larger than the size of a pencil eraser.  Stay current with your immunizations. Document Released: 07/11/2010 Document Revised: 04/22/2012 Document Reviewed: 07/11/2010 [facility] Patient Information 2014 [employer].

## 2014-07-17 NOTE — Progress Notes (Signed)
[name] 07/08/1970 [id number]        44 y.o.  G0P0 for annual exam.  Doing well without complaints  Past medical history,surgical history, problem list, medications, allergies, family history and social history were all reviewed and documented as reviewed in the EPIC chart.  ROS:  Performed with pertinent positives and negatives included in the history, assessment and plan.   Additional significant findings :  none   Exam: [facility] personassistant Filed Vitals:   07/17/14 0859  BP: 112/64  Height: 5' 4.5" (1.638 [name]  Weight: 125 lb (56.7 kg)   General appearance:  Normal affect, orientation and appearance. Skin: Grossly normal HEENT: Without gross lesions.  No cervical or supraclavicular adenopathy. Thyroid normal.  Lungs:  Clear without wheezing, rales or rhonchi Cardiac: RR, without RMG Abdominal:  Soft, nontender, without masses, guarding, rebound, organomegaly or hernia Breasts:  Examined lying and sitting without masses, retractions, discharge or axillary adenopathy. Pelvic:  Ext/BUS/vagina normal  Cervix normal  Uterus anteverted, normal size, shape and contour, midline and mobile nontender   Adnexa  Without masses or tenderness    Anus and perineum  Normal   Rectovaginal  Normal sphincter tone without palpated masses or tenderness.    Assessment/Plan:  44 y.o. G0P0 female for annual exam with regular menses, no contraception.   1. Contraception. Patient continues without contraception. History of infertility. Would accept pregnancy if occurs.  On multivitamin with folic acid. 2. Hypothyroid. Check TSH today. Refill Synthroid 125 g 1 year. 3. Pap smear 2014. No Pap smear done today. Plan repeat next year at three-year interval. 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 4. Mammography 09/2013. Continue with annual mammography. SBE monthly reviewed. 5. Health maintenance. Patient asked to do routine lab work. CBC, comprehensive metabolic panel, lipid profile,  TSH, urine analysis. Follow up in one year.   Dara LordsFONTAINE,Soniyah Mcglory P MD, 9:22 AM 07/17/2014

## 2014-11-11 ENCOUNTER — Other Ambulatory Visit: Payer: Self-pay

## 2014-11-11 DIAGNOSIS — Z1231 Encounter for screening mammogram for malignant neoplasm of breast: Secondary | ICD-10-CM

## 2014-12-09 ENCOUNTER — Telehealth: Payer: Self-pay

## 2014-12-09 NOTE — Telephone Encounter (Signed)
Had her  Yearly in July. She said you manage her hypothyroidism. She wants to see if you would order lab so she could come have her thyroid checked. She said she is feeling sluggish and bloated and this has happened before when it was off.

## 2014-12-09 NOTE — Telephone Encounter (Signed)
Okay for TSH

## 2014-12-10 ENCOUNTER — Other Ambulatory Visit: Payer: Self-pay | Admitting: Gynecology

## 2014-12-10 ENCOUNTER — Other Ambulatory Visit: Payer: Self-pay

## 2014-12-10 DIAGNOSIS — E039 Hypothyroidism, unspecified: Secondary | ICD-10-CM

## 2014-12-10 DIAGNOSIS — R4189 Other symptoms and signs involving cognitive functions and awareness: Secondary | ICD-10-CM

## 2014-12-10 NOTE — Telephone Encounter (Signed)
Patient informed.  Order placed and lab appt made. 

## 2014-12-14 ENCOUNTER — Other Ambulatory Visit: Payer: Managed Care, Other (non HMO)

## 2014-12-14 ENCOUNTER — Ambulatory Visit
Admission: RE | Admit: 2014-12-14 | Discharge: 2014-12-14 | Disposition: A | Discharge status: Home / Self Care | Payer: Managed Care, Other (non HMO) | Source: Ambulatory Visit

## 2014-12-14 DIAGNOSIS — R4189 Other symptoms and signs involving cognitive functions and awareness: Secondary | ICD-10-CM

## 2014-12-14 DIAGNOSIS — E039 Hypothyroidism, unspecified: Secondary | ICD-10-CM

## 2014-12-14 DIAGNOSIS — Z1231 Encounter for screening mammogram for malignant neoplasm of breast: Secondary | ICD-10-CM

## 2014-12-14 LAB — TSH: TSH: 4.04 u[IU]/mL (ref 0.350–4.500)

## 2014-12-15 ENCOUNTER — Other Ambulatory Visit: Payer: Self-pay | Admitting: Gynecology

## 2014-12-15 DIAGNOSIS — R7989 Other specified abnormal findings of blood chemistry: Secondary | ICD-10-CM

## 2014-12-15 MED ORDER — LEVOTHYROXINE SODIUM 137 MCG PO TABS: 137.0000 ug | ORAL_TABLET | Freq: Every day | ORAL | Status: DC

## 2015-02-06 ENCOUNTER — Other Ambulatory Visit: Payer: Self-pay | Admitting: Gynecology

## 2015-02-09 ENCOUNTER — Other Ambulatory Visit: Payer: Self-pay

## 2015-02-09 ENCOUNTER — Other Ambulatory Visit: Payer: Managed Care, Other (non HMO)

## 2015-02-09 DIAGNOSIS — R7989 Other specified abnormal findings of blood chemistry: Secondary | ICD-10-CM

## 2015-02-09 LAB — TSH: TSH: 2.217 u[IU]/mL (ref 0.350–4.500)

## 2015-02-09 MED ORDER — LEVOTHYROXINE SODIUM 137 MCG PO TABS: ORAL_TABLET | ORAL | Status: DC

## 2015-03-12 ENCOUNTER — Other Ambulatory Visit: Payer: Self-pay | Admitting: Gynecology

## 2015-08-07 ENCOUNTER — Other Ambulatory Visit: Payer: Self-pay | Admitting: Gynecology

## 2015-08-09 NOTE — Telephone Encounter (Signed)
Annual scheduled on 08/19/15

## 2015-08-19 ENCOUNTER — Encounter: Payer: Self-pay | Admitting: Gynecology

## 2015-08-19 ENCOUNTER — Ambulatory Visit (INDEPENDENT_AMBULATORY_CARE_PROVIDER_SITE_OTHER): Payer: Managed Care, Other (non HMO) | Admitting: Gynecology

## 2015-08-19 VITALS — BP 120/76 | Ht 64.0 in | Wt 126.0 lb

## 2015-08-19 DIAGNOSIS — Z01419 Encounter for gynecological examination (general) (routine) without abnormal findings: Secondary | ICD-10-CM | POA: Diagnosis not present

## 2015-08-19 DIAGNOSIS — Z13220 Encounter for screening for lipoid disorders: Secondary | ICD-10-CM

## 2015-08-19 DIAGNOSIS — E039 Hypothyroidism, unspecified: Secondary | ICD-10-CM

## 2015-08-19 LAB — CBC WITH DIFFERENTIAL/PLATELET
BASOS PCT: 1 %
Basophils Absolute: 44 cells/uL (ref 0–200)
Eosinophils Absolute: 132 cells/uL (ref 15–500)
Eosinophils Relative: 3 %
HEMATOCRIT: 40.9 % (ref 35.0–45.0)
HEMOGLOBIN: 13.5 g/dL (ref 11.7–15.5)
LYMPHS ABS: 1320 {cells}/uL (ref 850–3900)
Lymphocytes Relative: 30 %
MCH: 31.7 pg (ref 27.0–33.0)
MCHC: 33 g/dL (ref 32.0–36.0)
MCV: 96 fL (ref 80.0–100.0)
MONO ABS: 440 {cells}/uL (ref 200–950)
MPV: 10.1 fL (ref 7.5–12.5)
Monocytes Relative: 10 %
Neutro Abs: 2464 cells/uL (ref 1500–7800)
Neutrophils Relative %: 56 %
Platelets: 262 10*3/uL (ref 140–400)
RBC: 4.26 MIL/uL (ref 3.80–5.10)
RDW: 13.4 % (ref 11.0–15.0)
WBC: 4.4 10*3/uL (ref 3.8–10.8)

## 2015-08-19 LAB — LIPID PANEL
Cholesterol: 164 mg/dL (ref 125–200)
HDL: 80 mg/dL (ref >=46)
LDL Cholesterol: 75 mg/dL (ref <130)
TRIGLYCERIDES: 43 mg/dL (ref <150)
Total CHOL/HDL Ratio: 2.1 Ratio (ref <=5.0)
VLDL: 9 mg/dL (ref <30)

## 2015-08-19 LAB — COMPREHENSIVE METABOLIC PANEL
ALBUMIN: 4.6 g/dL (ref 3.6–5.1)
ALT: 14 U/L (ref 6–29)
AST: 16 U/L (ref 10–30)
Alkaline Phosphatase: 31 U/L — ABNORMAL LOW (ref 33–115)
BILIRUBIN TOTAL: 0.5 mg/dL (ref 0.2–1.2)
BUN: 14 mg/dL (ref 7–25)
CALCIUM: 9.4 mg/dL (ref 8.6–10.2)
CO2: 24 mmol/L (ref 20–31)
Chloride: 102 mmol/L (ref 98–110)
Creat: 0.68 mg/dL (ref 0.50–1.10)
Glucose, Bld: 83 mg/dL (ref 65–99)
Potassium: 4.4 mmol/L (ref 3.5–5.3)
Sodium: 135 mmol/L (ref 135–146)
Total Protein: 7 g/dL (ref 6.1–8.1)

## 2015-08-19 LAB — TSH: TSH: 2.51 m[IU]/L

## 2015-08-19 NOTE — Addendum Note (Signed)
Addended by: Dayna BarkerGARDNER, KIMBERLY K on: 08/19/2015 10:42 AM   Modules accepted: Orders

## 2015-08-19 NOTE — Addendum Note (Signed)
Addended by: Rushie GoltzSPANGLER, Nani Ingram on: 08/19/2015 10:29 AM   Modules accepted: Orders

## 2015-08-19 NOTE — Progress Notes (Signed)
    [name] 11/04/1970 [id number]        45 y.o.  G0P0  for annual exam.  Doing well without complaints  Past medical history,surgical history, problem list, medications, allergies, family history and social history were all reviewed and documented as reviewed in the EPIC chart.  ROS:  Performed with pertinent positives and negatives included in the history, assessment and plan.   Additional significant findings :  None   Exam: Kennon PortelaKim Gardner assistant Vitals:   08/19/15 0953  BP: 120/76  Weight: 126 lb (57.2 kg)  Height: 5\' 4"  (1.626 m)   Body mass index is 21.63 kg/m.  General appearance:  Normal affect, orientation and appearance. Skin: Grossly normal HEENT: Without gross lesions.  No cervical or supraclavicular adenopathy. Thyroid normal.  Lungs:  Clear without wheezing, rales or rhonchi Cardiac: RR, without RMG Abdominal:  Soft, nontender, without masses, guarding, rebound, organomegaly or hernia Breasts:  Examined lying and sitting without masses, retractions, discharge or axillary adenopathy. Pelvic:  Ext/BUS/Vagina normal  Cervix normal. Pap smear done  Uterus anteverted, normal size, shape and contour, midline and mobile nontender   Adnexa without masses or tenderness    Anus and perineum normal   Rectovaginal normal sphincter tone without palpated masses or tenderness.    Assessment/Plan:  45 y.o. G0P0 female for annual exam with regular menses, no contraception.   1. Contraception. Patient has long history of infertility and is not using contraception. She is on a multivitamin. Would accept pregnancy occurs. 2. Hypothyroid. Check TSH today. Refill Synthroid 125 g 1 year. 3. Pap smear 2014. Pap smear done today. History of CIN-1 1994 with normal Paps afterwards. 4. Mammography 12/2014. Continue with annual mammography when due. SBE monthly reviewed. 5. Health maintenance. Baseline CBC, CMP, lipid profile, TSH, urinalysis ordered. Follow up in one year,  sooner as needed.   Dara LordsFONTAINE,Zalea Pete P MD, 10:20 AM 08/19/2015

## 2015-08-19 NOTE — Patient Instructions (Signed)

## 2015-08-20 ENCOUNTER — Encounter: Payer: Self-pay | Admitting: Gynecology

## 2015-08-20 LAB — PAP IG W/ RFLX HPV ASCU

## 2015-09-05 ENCOUNTER — Other Ambulatory Visit: Payer: Self-pay | Admitting: Gynecology

## 2015-12-21 ENCOUNTER — Other Ambulatory Visit: Payer: Self-pay | Admitting: Gynecology

## 2015-12-21 DIAGNOSIS — Z1231 Encounter for screening mammogram for malignant neoplasm of breast: Secondary | ICD-10-CM

## 2015-12-28 ENCOUNTER — Ambulatory Visit
Admission: RE | Admit: 2015-12-28 | Discharge: 2015-12-28 | Disposition: A | Discharge status: Home / Self Care | Payer: Managed Care, Other (non HMO) | Source: Ambulatory Visit | Attending: Gynecology | Admitting: Gynecology

## 2015-12-28 DIAGNOSIS — Z1231 Encounter for screening mammogram for malignant neoplasm of breast: Secondary | ICD-10-CM

## 2016-01-24 ENCOUNTER — Telehealth: Payer: Self-pay | Admitting: *Deleted

## 2016-01-24 NOTE — Telephone Encounter (Signed)
Pt informed with the below note, will have front desk call to schedule. 

## 2016-01-24 NOTE — Telephone Encounter (Signed)
Pt called c/o x 2 months sluggish, fatigue, bloated and just no energy. Asked if she could have her TSH level checked? Please advise

## 2016-01-24 NOTE — Telephone Encounter (Signed)
Exam appointment 

## 2016-01-25 ENCOUNTER — Encounter: Payer: Self-pay | Admitting: Gynecology

## 2016-01-25 ENCOUNTER — Ambulatory Visit (INDEPENDENT_AMBULATORY_CARE_PROVIDER_SITE_OTHER): Payer: Managed Care, Other (non HMO) | Admitting: Gynecology

## 2016-01-25 VITALS — BP 116/72

## 2016-01-25 DIAGNOSIS — R5383 Other fatigue: Secondary | ICD-10-CM

## 2016-01-25 DIAGNOSIS — R140 Abdominal distension (gaseous): Secondary | ICD-10-CM

## 2016-01-25 LAB — COMPREHENSIVE METABOLIC PANEL
ALT: 14 U/L (ref 6–29)
AST: 15 U/L (ref 10–35)
Albumin: 4.4 g/dL (ref 3.6–5.1)
Alkaline Phosphatase: 28 U/L — ABNORMAL LOW (ref 33–115)
BUN: 12 mg/dL (ref 7–25)
CO2: 25 mmol/L (ref 20–31)
CREATININE: 0.62 mg/dL (ref 0.50–1.10)
Calcium: 9.1 mg/dL (ref 8.6–10.2)
Chloride: 103 mmol/L (ref 98–110)
Glucose, Bld: 95 mg/dL (ref 65–99)
POTASSIUM: 4.2 mmol/L (ref 3.5–5.3)
Sodium: 137 mmol/L (ref 135–146)
TOTAL PROTEIN: 6.9 g/dL (ref 6.1–8.1)
Total Bilirubin: 0.5 mg/dL (ref 0.2–1.2)

## 2016-01-25 LAB — CBC WITH DIFFERENTIAL/PLATELET
BASOS ABS: 41 {cells}/uL (ref 0–200)
Basophils Relative: 1 %
EOS PCT: 3 %
Eosinophils Absolute: 123 cells/uL (ref 15–500)
HCT: 40.3 % (ref 35.0–45.0)
Hemoglobin: 13.3 g/dL (ref 11.7–15.5)
Lymphocytes Relative: 35 %
Lymphs Abs: 1435 cells/uL (ref 850–3900)
MCH: 31.6 pg (ref 27.0–33.0)
MCHC: 33 g/dL (ref 32.0–36.0)
MCV: 95.7 fL (ref 80.0–100.0)
MPV: 9.5 fL (ref 7.5–12.5)
Monocytes Absolute: 369 cells/uL (ref 200–950)
Monocytes Relative: 9 %
Neutro Abs: 2132 cells/uL (ref 1500–7800)
Neutrophils Relative %: 52 %
PLATELETS: 262 10*3/uL (ref 140–400)
RBC: 4.21 MIL/uL (ref 3.80–5.10)
RDW: 12.8 % (ref 11.0–15.0)
WBC: 4.1 10*3/uL (ref 3.8–10.8)

## 2016-01-25 LAB — TSH: TSH: 2.2 mIU/L

## 2016-01-25 NOTE — Progress Notes (Signed)
    [name] 07/20/1970 [id number]        46 y.o.  G0P0 presents complaining of 2 months of fatigue and abdominal bloating. Having regular monthly menses. Gained approximately 4 pounds over the past year. No skin or hair changes. Is on Synthroid supplement. No diarrhea constipation nausea vomiting fever chills hot flushes night sweats.  Past medical history,surgical history, problem list, medications, allergies, family history and social history were all reviewed and documented in the EPIC chart.  Directed ROS with pertinent positives and negatives documented in the history of present illness/assessment and plan.  Exam: Madison PortelaKim [name] assistant Vitals:   01/25/16 0910  BP: 116/72   General appearance:  Normal HEENT normal Lungs clear Cardiac regular rate no rubs murmurs or gallops Abdomen soft with active bowel sounds. No masses guarding rebound Pelvic external BUS vagina normal. Cervix normal. Uterus normal size midline mobile nontender. Adnexa without masses or tenderness.  Assessment/Plan:  46 y.o. G0P0 with fatigue and abdominal bloating. Exam is normal. Check baseline labs to include CBC comprehensive metabolic panel TSH FSH and pelvic ultrasound rule out nonpalpable abnormalities.  Patient will follow up for the lab results and ultrasound and then we will go from there.    Madison LordsFONTAINE,Samaiyah Howes P MD, 9:22 AM 01/25/2016

## 2016-01-25 NOTE — Patient Instructions (Addendum)
Follow up for ultrasound as scheduled ultrasound as scheduled

## 2016-01-25 NOTE — Telephone Encounter (Signed)
I think rescheduling earlier in the cycle is a good idea.

## 2016-01-26 LAB — FOLLICLE STIMULATING HORMONE: FSH: 6.3 m[IU]/mL

## 2016-01-28 ENCOUNTER — Telehealth: Payer: Self-pay

## 2016-01-28 NOTE — Telephone Encounter (Signed)
Patient called because she received my message that her lab results were all normal per TF.  She asked if he had any thoughts on it. I called her back and once again got her voice mail. I left message that Dr. Norva RiffleF'soffice note said he wanted to get lab results and u/s  Done and go from there. I told her to keep u/s appt.

## 2016-02-07 ENCOUNTER — Other Ambulatory Visit: Payer: Managed Care, Other (non HMO)

## 2016-02-07 ENCOUNTER — Ambulatory Visit: Payer: Managed Care, Other (non HMO) | Admitting: Gynecology

## 2016-02-16 ENCOUNTER — Ambulatory Visit: Payer: Managed Care, Other (non HMO) | Admitting: Gynecology

## 2016-02-16 ENCOUNTER — Other Ambulatory Visit: Payer: Managed Care, Other (non HMO)

## 2016-07-04 ENCOUNTER — Other Ambulatory Visit: Payer: Self-pay | Admitting: Gynecology

## 2016-08-10 ENCOUNTER — Other Ambulatory Visit: Payer: Self-pay

## 2016-08-10 MED ORDER — LEVOTHYROXINE SODIUM 137 MCG PO TABS: ORAL_TABLET | ORAL | 0 refills | Status: DC

## 2016-08-17 ENCOUNTER — Other Ambulatory Visit: Payer: Self-pay

## 2016-08-17 MED ORDER — LEVOTHYROXINE SODIUM 137 MCG PO TABS: ORAL_TABLET | ORAL | 0 refills | Status: DC

## 2016-08-17 NOTE — Telephone Encounter (Signed)
Pharmacy requested 90 day Rx. CE is scheduled for 08/18/16.

## 2016-08-22 ENCOUNTER — Ambulatory Visit (INDEPENDENT_AMBULATORY_CARE_PROVIDER_SITE_OTHER): Payer: Managed Care, Other (non HMO) | Admitting: Gynecology

## 2016-08-22 ENCOUNTER — Encounter: Payer: Self-pay | Admitting: Gynecology

## 2016-08-22 VITALS — BP 116/74 | Ht 64.0 in | Wt 127.0 lb

## 2016-08-22 DIAGNOSIS — Z01419 Encounter for gynecological examination (general) (routine) without abnormal findings: Secondary | ICD-10-CM | POA: Diagnosis not present

## 2016-08-22 DIAGNOSIS — E039 Hypothyroidism, unspecified: Secondary | ICD-10-CM

## 2016-08-22 DIAGNOSIS — Z13220 Encounter for screening for lipoid disorders: Secondary | ICD-10-CM | POA: Diagnosis not present

## 2016-08-22 LAB — CBC WITH DIFFERENTIAL/PLATELET
Basophils Absolute: 44 cells/uL (ref 0–200)
Basophils Relative: 1 %
EOS ABS: 132 {cells}/uL (ref 15–500)
Eosinophils Relative: 3 %
HEMATOCRIT: 38.4 % (ref 35.0–45.0)
HEMOGLOBIN: 12.7 g/dL (ref 11.7–15.5)
LYMPHS ABS: 1320 {cells}/uL (ref 850–3900)
Lymphocytes Relative: 30 %
MCH: 31.8 pg (ref 27.0–33.0)
MCHC: 33.1 g/dL (ref 32.0–36.0)
MCV: 96 fL (ref 80.0–100.0)
MONO ABS: 352 {cells}/uL (ref 200–950)
MPV: 10.1 fL (ref 7.5–12.5)
Monocytes Relative: 8 %
NEUTROS PCT: 58 %
Neutro Abs: 2552 cells/uL (ref 1500–7800)
Platelets: 257 10*3/uL (ref 140–400)
RBC: 4 MIL/uL (ref 3.80–5.10)
RDW: 13.1 % (ref 11.0–15.0)
WBC: 4.4 10*3/uL (ref 3.8–10.8)

## 2016-08-22 LAB — TSH: TSH: 0.46 mIU/L

## 2016-08-22 LAB — URINALYSIS W MICROSCOPIC + REFLEX CULTURE
Bacteria, UA: NONE SEEN [HPF]
Bilirubin Urine: NEGATIVE
CASTS: NONE SEEN [LPF]
CRYSTALS: NONE SEEN [HPF]
Glucose, UA: NEGATIVE
Hgb urine dipstick: NEGATIVE
KETONES UR: NEGATIVE
Leukocytes, UA: NEGATIVE
Nitrite: NEGATIVE
Protein, ur: NEGATIVE
RBC / HPF: NONE SEEN RBC/HPF (ref <=2)
Specific Gravity, Urine: 1.02 (ref 1.001–1.035)
WBC, UA: NONE SEEN WBC/HPF (ref <=5)
Yeast: NONE SEEN [HPF]
pH: 5.5 (ref 5.0–8.0)

## 2016-08-22 NOTE — Progress Notes (Signed)
    Madison Madison [name] Madison Madison [name] 11/05/1970 [id number]        46 y.o.  G0P0 for annual exam.    Past medical history,surgical history, problem list, medications, allergies, family history and social history were all reviewed and documented as reviewed in the EPIC chart.  ROS:  Performed with pertinent positives and negatives included in the history, assessment and plan.   Additional significant findings :  None   Exam: Kennon PortelaKim [name] assistant Vitals:   08/22/16 0902  BP: 116/74  Weight: 127 lb (57.6 kg)  Height: 5\' 4"  (1.626 Madison [name]   Body mass index is 21.8 kg/Madison [name]  General appearance:  Normal affect, orientation and appearance. Skin: Grossly normal HEENT: Without gross lesions.  No cervical or supraclavicular adenopathy. Thyroid normal.  Lungs:  Clear without wheezing, rales or rhonchi Cardiac: RR, with faint 1-2/6 systolic ejection murmur Abdominal:  Soft, nontender, without masses, guarding, rebound, organomegaly or hernia Breasts:  Examined lying and sitting without masses, retractions, discharge or axillary adenopathy. Pelvic:  Ext, BUS, Vagina: Normal  Cervix: Normal  Uterus: Anteverted, normal size, shape and contour, midline and mobile nontender   Adnexa: Without masses or tenderness    Anus and perineum: Normal   Rectovaginal: Normal sphincter tone without palpated masses or tenderness.    Assessment/Plan:  46 y.o. G0P0 female for annual exam with regular menses, no contraception.   1. Contraception. I again reviewed the possibility of pregnancy using no contraception. Long-standing infertility. Patient understands maternal and fetal risks and would accept pregnancy if it occurs. 2. Hypothyroid. Check TSH today. Refill Synthroid 125 g daily 3. Mammography 12/2016. Continue with annual mammography this December. Breast exam normal. 4. Pap smear 2017. No Pap smear done today. History of CIN-1 1994 with normal Pap smears since then. Repeat Pap smear at 3 year interval per current  screening guidelines. 5. Health maintenance. Baseline CBC, CMP, lipid profile, TSH, urinalysis ordered. Follow up 1 year, sooner as needed.   Madison Madison [name],Madison Mirarchi P MD, 9:34 AM 08/22/2016

## 2016-08-22 NOTE — Patient Instructions (Signed)
Followup in one year for annual exam, sooner as needed. 

## 2016-08-23 ENCOUNTER — Encounter: Payer: Self-pay | Admitting: Gynecology

## 2016-08-23 LAB — COMPREHENSIVE METABOLIC PANEL
ALT: 10 U/L (ref 6–29)
AST: 13 U/L (ref 10–35)
Albumin: 4.3 g/dL (ref 3.6–5.1)
Alkaline Phosphatase: 31 U/L — ABNORMAL LOW (ref 33–115)
BUN: 14 mg/dL (ref 7–25)
CALCIUM: 8.9 mg/dL (ref 8.6–10.2)
CO2: 21 mmol/L (ref 20–32)
Chloride: 104 mmol/L (ref 98–110)
Creat: 0.63 mg/dL (ref 0.50–1.10)
Glucose, Bld: 92 mg/dL (ref 65–99)
Potassium: 4.2 mmol/L (ref 3.5–5.3)
Sodium: 137 mmol/L (ref 135–146)
Total Bilirubin: 0.4 mg/dL (ref 0.2–1.2)
Total Protein: 6.5 g/dL (ref 6.1–8.1)

## 2016-08-23 LAB — LIPID PANEL
Cholesterol: 160 mg/dL (ref <200)
HDL: 70 mg/dL (ref >50)
LDL Cholesterol: 83 mg/dL (ref <100)
TRIGLYCERIDES: 34 mg/dL (ref <150)
Total CHOL/HDL Ratio: 2.3 Ratio (ref <5.0)
VLDL: 7 mg/dL (ref <30)

## 2016-09-02 ENCOUNTER — Other Ambulatory Visit: Payer: Self-pay | Admitting: Gynecology

## 2016-09-04 ENCOUNTER — Other Ambulatory Visit: Payer: Self-pay | Admitting: Gynecology

## 2016-09-04 NOTE — Telephone Encounter (Signed)
Office notes states 125 mcg synthroid at OV 8/14/1, but a Rx was sent for 137 mcg synthroid.?

## 2016-09-04 NOTE — Telephone Encounter (Signed)
Check with patient to see what dose she is currently taking.  Historically it looks like she was taking the 137. She was taking the 125 previously but we increased her to the 137. Refill whatever she is taking now.

## 2017-02-09 ENCOUNTER — Other Ambulatory Visit: Payer: Self-pay | Admitting: Gynecology

## 2017-02-09 DIAGNOSIS — Z1231 Encounter for screening mammogram for malignant neoplasm of breast: Secondary | ICD-10-CM

## 2017-02-28 ENCOUNTER — Ambulatory Visit
Admission: RE | Admit: 2017-02-28 | Discharge: 2017-02-28 | Disposition: A | Discharge status: Home / Self Care | Payer: 59 | Source: Ambulatory Visit | Attending: Gynecology | Admitting: Gynecology

## 2017-02-28 DIAGNOSIS — Z1231 Encounter for screening mammogram for malignant neoplasm of breast: Secondary | ICD-10-CM

## 2017-05-11 ENCOUNTER — Telehealth: Payer: Self-pay | Admitting: *Deleted

## 2017-05-11 NOTE — Telephone Encounter (Signed)
Pt informed

## 2017-05-11 NOTE — Telephone Encounter (Signed)
Patient said she has been feeling tired, noticed increase weight gain, no change in lifestyle, asked if she could come have TSH level checked? Please advise

## 2017-05-11 NOTE — Telephone Encounter (Signed)
She would need office visit

## 2017-05-14 ENCOUNTER — Ambulatory Visit (INDEPENDENT_AMBULATORY_CARE_PROVIDER_SITE_OTHER): Payer: BLUE CROSS/BLUE SHIELD | Admitting: Gynecology

## 2017-05-14 ENCOUNTER — Encounter: Payer: Self-pay | Admitting: Gynecology

## 2017-05-14 VITALS — BP 118/76

## 2017-05-14 DIAGNOSIS — N924 Excessive bleeding in the premenopausal period: Secondary | ICD-10-CM | POA: Diagnosis not present

## 2017-05-14 DIAGNOSIS — R5383 Other fatigue: Secondary | ICD-10-CM | POA: Diagnosis not present

## 2017-05-14 DIAGNOSIS — E039 Hypothyroidism, unspecified: Secondary | ICD-10-CM

## 2017-05-14 NOTE — Progress Notes (Signed)
    Madison [name] [facility] 1970/05/21 [id number]        47 y.o.  G0P0 presents complaining of 6 months or so of fatigue and 6 pound weight gain.  Is active and has not changed her diet.  Is being followed for hypothyroid on Synthroid replacement with TSH checked within the past year was normal.  No skin/rashes or hair changes.  No nausea vomiting diarrhea constipation.  Past medical history,surgical history, problem list, medications, allergies, family history and social history were all reviewed and documented in the EPIC chart.  Directed ROS with pertinent positives and negatives documented in the history of present illness/assessment and plan.  Exam: Vitals:   05/14/17 1532  BP: 118/76   General appearance:  Normal HEENT normal with no evidence of thyromegaly or palpable abnormalities. Lungs clear bilaterally Cardiac regular rate without rubs murmurs or gallops Abdomen soft nontender without masses guarding rebound  Assessment/Plan:  48 y.o. G0P0 with fatigue and some mild weight gain over the last 6 months.  Also notes her periods are little closer and heavier.  I reviewed with her this is not unusual for her age to see her menses become a little heavier and closer together.  Fatigue may also be related to hormonal changes.  We reviewed the perimenopause and although she is having regular monthly menses she certainly may be feeling some hormonal changes.  Will start with a fatigue type work-up to include CBC, CMP, thyroid panel with TSH, vitamin D, ANA.  Reviewed various possibilities with her.  If all labs are normal options to follow-up with her primary for further evaluation discussed versus following for now.  We also reviewed the heavier menses and options for this.  Not using contraception and would accept pregnancy if it occurred at this time.  Is on a multivitamin with folic acid.  Possible trial of Mirena IUD for both menstrual suppression and contraception discussed.  She wants to think  about this and will follow-up for her blood test results.  Greater than 50% of my 15-minute office visit was spent in direct face to face counseling and coordination of care with the patient.   Dara Lords MD, 3:53 PM 05/14/2017

## 2017-05-14 NOTE — Patient Instructions (Signed)
Follow-up for blood test results.  Follow-up if you want to pursue a Mirena IUD.

## 2017-05-16 ENCOUNTER — Other Ambulatory Visit: Payer: Self-pay | Admitting: Gynecology

## 2017-05-16 LAB — CBC WITH DIFFERENTIAL/PLATELET
BASOS ABS: 50 {cells}/uL (ref 0–200)
Basophils Relative: 0.8 %
EOS ABS: 202 {cells}/uL (ref 15–500)
EOS PCT: 3.2 %
HCT: 38.5 % (ref 35.0–45.0)
Hemoglobin: 12.9 g/dL (ref 11.7–15.5)
Lymphs Abs: 1947 cells/uL (ref 850–3900)
MCH: 31 pg (ref 27.0–33.0)
MCHC: 33.5 g/dL (ref 32.0–36.0)
MCV: 92.5 fL (ref 80.0–100.0)
MONOS PCT: 7.6 %
MPV: 10.3 fL (ref 7.5–12.5)
Neutro Abs: 3623 cells/uL (ref 1500–7800)
Neutrophils Relative %: 57.5 %
PLATELETS: 302 10*3/uL (ref 140–400)
RBC: 4.16 10*6/uL (ref 3.80–5.10)
RDW: 12 % (ref 11.0–15.0)
TOTAL LYMPHOCYTE: 30.9 %
WBC mixed population: 479 cells/uL (ref 200–950)
WBC: 6.3 10*3/uL (ref 3.8–10.8)

## 2017-05-16 LAB — COMPREHENSIVE METABOLIC PANEL
AG RATIO: 1.8 (calc) (ref 1.0–2.5)
ALT: 14 U/L (ref 6–29)
AST: 16 U/L (ref 10–35)
Albumin: 4.4 g/dL (ref 3.6–5.1)
Alkaline phosphatase (APISO): 42 U/L (ref 33–115)
BUN: 14 mg/dL (ref 7–25)
CO2: 28 mmol/L (ref 20–32)
Calcium: 9.2 mg/dL (ref 8.6–10.2)
Chloride: 101 mmol/L (ref 98–110)
Creat: 0.58 mg/dL (ref 0.50–1.10)
GLUCOSE: 82 mg/dL (ref 65–99)
Globulin: 2.4 g/dL (calc) (ref 1.9–3.7)
Potassium: 4.2 mmol/L (ref 3.5–5.3)
SODIUM: 137 mmol/L (ref 135–146)
TOTAL PROTEIN: 6.8 g/dL (ref 6.1–8.1)
Total Bilirubin: 0.6 mg/dL (ref 0.2–1.2)

## 2017-05-16 LAB — THYROID PANEL WITH TSH
FREE THYROXINE INDEX: 3.7 (ref 1.4–3.8)
T3 UPTAKE: 36 % — AB (ref 22–35)
T4 TOTAL: 10.2 ug/dL (ref 5.1–11.9)
TSH: 0.36 mIU/L — ABNORMAL LOW

## 2017-05-16 LAB — ANTI-NUCLEAR AB-TITER (ANA TITER): ANA Titer 1: 1:320 {titer} — ABNORMAL HIGH

## 2017-05-16 LAB — VITAMIN D 25 HYDROXY (VIT D DEFICIENCY, FRACTURES): Vit D, 25-Hydroxy: 39 ng/mL (ref 30–100)

## 2017-05-16 LAB — ANA: ANA: POSITIVE — AB

## 2017-05-16 MED ORDER — LEVOTHYROXINE SODIUM 125 MCG PO TABS: 125.0000 ug | ORAL_TABLET | Freq: Every day | ORAL | 2 refills | Status: DC

## 2017-05-17 ENCOUNTER — Encounter: Payer: Self-pay | Admitting: Gynecology

## 2017-08-09 DIAGNOSIS — R87610 Atypical squamous cells of undetermined significance on cytologic smear of cervix (ASC-US): Secondary | ICD-10-CM

## 2017-08-09 HISTORY — DX: Atypical squamous cells of undetermined significance on cytologic smear of cervix (ASC-US): R87.610

## 2017-08-10 ENCOUNTER — Other Ambulatory Visit: Payer: Self-pay | Admitting: Gynecology

## 2017-08-28 ENCOUNTER — Ambulatory Visit (INDEPENDENT_AMBULATORY_CARE_PROVIDER_SITE_OTHER): Payer: BLUE CROSS/BLUE SHIELD | Admitting: Gynecology

## 2017-08-28 ENCOUNTER — Encounter: Payer: Self-pay | Admitting: Gynecology

## 2017-08-28 VITALS — BP 118/74 | Ht 64.0 in | Wt 132.0 lb

## 2017-08-28 DIAGNOSIS — Z13220 Encounter for screening for lipoid disorders: Secondary | ICD-10-CM | POA: Diagnosis not present

## 2017-08-28 DIAGNOSIS — E038 Other specified hypothyroidism: Secondary | ICD-10-CM

## 2017-08-28 DIAGNOSIS — R87610 Atypical squamous cells of undetermined significance on cytologic smear of cervix (ASC-US): Secondary | ICD-10-CM | POA: Diagnosis not present

## 2017-08-28 DIAGNOSIS — Z01419 Encounter for gynecological examination (general) (routine) without abnormal findings: Secondary | ICD-10-CM | POA: Diagnosis not present

## 2017-08-28 NOTE — Addendum Note (Signed)
Addended by: Dayna BarkerGARDNER, Lalah Durango K on: 08/28/2017 09:08 AM   Modules accepted: Orders

## 2017-08-28 NOTE — Patient Instructions (Signed)
Follow up for the IUD placement if you choose.  Follow-up for recheck of the ANA titer in 6 months  Follow-up in 1 year for annual exam

## 2017-08-28 NOTE — Progress Notes (Signed)
    Madison [name] 10/04/1970 [id number]        47 y.o.  G0P0 for annual gynecologic exam.  Several issues noted below.  Past medical history,surgical history, problem list, medications, allergies, family history and social history were all reviewed and documented as reviewed in the EPIC chart.  ROS:  Performed with pertinent positives and negatives included in the history, assessment and plan.   Additional significant findings : None   Exam: Kennon PortelaKim Gardner assistant Vitals:   08/28/17 0808  BP: 118/74  Weight: 132 lb (59.9 kg)  Height: 5\' 4"  (1.626 m)   Body mass index is 22.66 kg/m.  General appearance:  Normal affect, orientation and appearance. Skin: Grossly normal HEENT: Without gross lesions.  No cervical or supraclavicular adenopathy. Thyroid normal.  Lungs:  Clear without wheezing, rales or rhonchi Cardiac: RR, without RMG Abdominal:  Soft, nontender, without masses, guarding, rebound, organomegaly or hernia Breasts:  Examined lying and sitting without masses, retractions, discharge or axillary adenopathy. Pelvic:  Ext, BUS, Vagina: Normal  Cervix: Normal.  Pap smear done  Uterus: Anteverted, normal size, shape and contour, midline and mobile nontender   Adnexa: Without masses or tenderness    Anus and perineum: Normal   Rectovaginal: Normal sphincter tone without palpated masses or tenderness.    Assessment/Plan:  47 y.o. G0P0 female for annual gynecologic exam with regular menses, no contraception.   1. Contraception.  Patient with long history of infertility.  Currently not using contraception.  Would accept pregnancy if recurs.  Is thinking of IUD as below. 2. Menorrhagia.  Menses continue heavy and regular.  With discussed this in the past and she is contemplating Mirena IUD.  She will schedule in follow-up for this if she chooses. 3. Hypothyroid.  Recently had TSH mildly depressed at 0.36.  On Synthroid 125 mcg.  We will recheck TSH now.  Adjust Synthroid as  needed. 4. Recent evaluation for fatigue showed ANA titer 1:320 with speckled pattern.  Saw her primary physician who did not feel any further work-up needed as she is without signs or symptoms of connective tissue disorders.  I recommend that she have the ANA rechecked at 5070-month interval. 5. Mammography 02/2017.  Continue with annual mammography when due.  Breast exam normal today. 6. Pap smear 2017.  Pap smear done today.  History of CIN-1 1994 with normal Pap smears since. 7. Health maintenance.  Recent blood work done with the exception of lipid profile.  Will check lipid profile along with her TSH today.  Follow-up for Mirena IUD if she chooses.  Follow-up for ANA in 6 months.  Follow-up for annual exam in 1 year.   Dara Lordsimothy P Trevan Messman MD, 8:42 AM 08/28/2017

## 2017-08-29 LAB — LIPID PANEL
Cholesterol: 176 mg/dL (ref <200)
HDL: 79 mg/dL (ref >50)
LDL Cholesterol (Calc): 86 mg/dL (calc)
NON-HDL CHOLESTEROL (CALC): 97 mg/dL (ref <130)
TRIGLYCERIDES: 38 mg/dL (ref <150)
Total CHOL/HDL Ratio: 2.2 (calc) (ref <5.0)

## 2017-08-29 LAB — TSH: TSH: 1.67 mIU/L

## 2017-08-31 ENCOUNTER — Encounter: Payer: Self-pay | Admitting: Gynecology

## 2017-08-31 LAB — PAP IG W/ RFLX HPV ASCU

## 2017-08-31 LAB — HUMAN PAPILLOMAVIRUS, HIGH RISK: HPV DNA High Risk: NOT DETECTED

## 2017-09-05 NOTE — Telephone Encounter (Signed)
Thyroid and lipid profile normal

## 2017-11-05 ENCOUNTER — Other Ambulatory Visit: Payer: Self-pay | Admitting: Gynecology

## 2017-11-05 NOTE — Telephone Encounter (Signed)
At 08/28/17 visit you wrote "Hypothyroid.  Recently had TSH mildly depressed at 0.36.  On Synthroid 125 mcg.  We will recheck TSH now.  Adjust Synthroid as needed."  08/28/17 TSH was normal at 1.67

## 2018-02-11 ENCOUNTER — Other Ambulatory Visit: Payer: Self-pay | Admitting: *Deleted

## 2018-02-11 DIAGNOSIS — R5383 Other fatigue: Secondary | ICD-10-CM

## 2018-06-05 ENCOUNTER — Other Ambulatory Visit: Payer: Self-pay | Admitting: Gynecology

## 2018-06-05 DIAGNOSIS — Z1231 Encounter for screening mammogram for malignant neoplasm of breast: Secondary | ICD-10-CM

## 2018-07-23 ENCOUNTER — Other Ambulatory Visit: Payer: Self-pay

## 2018-07-23 ENCOUNTER — Ambulatory Visit
Admission: RE | Admit: 2018-07-23 | Discharge: 2018-07-23 | Disposition: A | Discharge status: Home / Self Care | Payer: BC Managed Care – PPO | Source: Ambulatory Visit | Attending: Gynecology | Admitting: Gynecology

## 2018-07-23 DIAGNOSIS — Z1231 Encounter for screening mammogram for malignant neoplasm of breast: Secondary | ICD-10-CM

## 2018-09-04 ENCOUNTER — Encounter: Payer: BLUE CROSS/BLUE SHIELD | Admitting: Gynecology

## 2018-09-06 ENCOUNTER — Encounter: Payer: Self-pay | Admitting: Gynecology

## 2018-09-06 ENCOUNTER — Other Ambulatory Visit: Payer: Self-pay

## 2018-09-06 ENCOUNTER — Ambulatory Visit (INDEPENDENT_AMBULATORY_CARE_PROVIDER_SITE_OTHER): Payer: BC Managed Care – PPO | Admitting: Gynecology

## 2018-09-06 VITALS — BP 118/76 | Ht 64.0 in | Wt 128.0 lb

## 2018-09-06 DIAGNOSIS — Z13220 Encounter for screening for lipoid disorders: Secondary | ICD-10-CM

## 2018-09-06 DIAGNOSIS — Z01419 Encounter for gynecological examination (general) (routine) without abnormal findings: Secondary | ICD-10-CM | POA: Diagnosis not present

## 2018-09-06 DIAGNOSIS — R7989 Other specified abnormal findings of blood chemistry: Secondary | ICD-10-CM | POA: Diagnosis not present

## 2018-09-06 DIAGNOSIS — R768 Other specified abnormal immunological findings in serum: Secondary | ICD-10-CM

## 2018-09-06 DIAGNOSIS — E038 Other specified hypothyroidism: Secondary | ICD-10-CM

## 2018-09-06 DIAGNOSIS — R87610 Atypical squamous cells of undetermined significance on cytologic smear of cervix (ASC-US): Secondary | ICD-10-CM

## 2018-09-06 DIAGNOSIS — E559 Vitamin D deficiency, unspecified: Secondary | ICD-10-CM | POA: Diagnosis not present

## 2018-09-06 MED ORDER — LEVOTHYROXINE SODIUM 125 MCG PO TABS: ORAL_TABLET | ORAL | 4 refills | Status: DC

## 2018-09-06 NOTE — Patient Instructions (Signed)
Follow-up in 1 year for annual exam 

## 2018-09-06 NOTE — Progress Notes (Signed)
    Madison [name] [facility] 03/20/1970 [id number]        48 y.o.  G0P0 for annual gynecologic exam.  Without gynecologic complaints  Past medical history,surgical history, problem list, medications, allergies, family history and social history were all reviewed and documented as reviewed in the EPIC chart.  ROS:  Performed with pertinent positives and negatives included in the history, assessment and plan.   Additional significant findings : None   Exam: Madison [name] assistant Vitals:   09/06/18 0929  BP: 118/76  Weight: 128 lb (58.1 kg)  Height: 5\' 4"  (1.626 m)   Body mass index is 21.97 kg/m.  General appearance:  Normal affect, orientation and appearance. Skin: Grossly normal HEENT: Without gross lesions.  No cervical or supraclavicular adenopathy. Thyroid normal.  Lungs:  Clear without wheezing, rales or rhonchi Cardiac: RR, without RMG Abdominal:  Soft, nontender, without masses, guarding, rebound, organomegaly or hernia Breasts:  Examined lying and sitting without masses, retractions, discharge or axillary adenopathy. Pelvic:  Ext, BUS, Vagina: Normal  Cervix: Normal  Uterus: Anteverted, normal size, shape and contour, midline and mobile nontender   Adnexa: Without masses or tenderness    Anus and perineum: Normal   Rectovaginal: Normal sphincter tone without palpated masses or tenderness.    Assessment/Plan:  48 y.o. G0P0 female for annual gynecologic exam.  With regular menses, no contraception  1. Contraception.  Patient with long history of infertility not using contraception.  Would except pregnancy if recurs. 2. Hypothyroid.  Check TSH today.  Refill Synthroid. 3. History of elevated ANA at 1-320 with speckled pattern.  Saw primary who felt in the absence of symptoms no further work-up needed.  She continues with no symptoms such as rash muscle or joint pain.  Will check CMP for liver function/renal function.  Recheck ANA. 4. ASCUS Pap smear last year negative  high-risk HPV.  Pap smear done today.  History of CIN-1 1994 with normal Paps until last year. 5. Mammography 07/2018.  Continue annual mammography next year.  Breast exam normal today. 6. Health maintenance.  Baseline CBC, CMP, lipid profile, TSH, ANA, vitamin D secondary to low vitamin D in the past ordered.  Follow-up 1 year, sooner as needed.   Anastasio Auerbach MD, 10:07 AM 09/06/2018

## 2018-09-06 NOTE — Addendum Note (Signed)
Addended by: Nelva Nay on: 09/06/2018 11:00 AM   Modules accepted: Orders

## 2018-09-09 LAB — COMPREHENSIVE METABOLIC PANEL
AG Ratio: 1.9 (calc) (ref 1.0–2.5)
ALT: 13 U/L (ref 6–29)
AST: 15 U/L (ref 10–35)
Albumin: 4.5 g/dL (ref 3.6–5.1)
Alkaline phosphatase (APISO): 31 U/L (ref 31–125)
BUN: 12 mg/dL (ref 7–25)
CO2: 27 mmol/L (ref 20–32)
Calcium: 9.2 mg/dL (ref 8.6–10.2)
Chloride: 107 mmol/L (ref 98–110)
Creat: 0.63 mg/dL (ref 0.50–1.10)
Globulin: 2.4 g/dL (calc) (ref 1.9–3.7)
Glucose, Bld: 91 mg/dL (ref 65–99)
Potassium: 4.3 mmol/L (ref 3.5–5.3)
Sodium: 142 mmol/L (ref 135–146)
Total Bilirubin: 0.4 mg/dL (ref 0.2–1.2)
Total Protein: 6.9 g/dL (ref 6.1–8.1)

## 2018-09-09 LAB — CBC WITH DIFFERENTIAL/PLATELET
Absolute Monocytes: 368 cells/uL (ref 200–950)
Basophils Absolute: 51 cells/uL (ref 0–200)
Basophils Relative: 1.1 %
Eosinophils Absolute: 161 cells/uL (ref 15–500)
Eosinophils Relative: 3.5 %
HCT: 39 % (ref 35.0–45.0)
Hemoglobin: 12.9 g/dL (ref 11.7–15.5)
Lymphs Abs: 1601 cells/uL (ref 850–3900)
MCH: 31.9 pg (ref 27.0–33.0)
MCHC: 33.1 g/dL (ref 32.0–36.0)
MCV: 96.3 fL (ref 80.0–100.0)
MPV: 10.6 fL (ref 7.5–12.5)
Monocytes Relative: 8 %
Neutro Abs: 2420 cells/uL (ref 1500–7800)
Neutrophils Relative %: 52.6 %
Platelets: 273 10*3/uL (ref 140–400)
RBC: 4.05 10*6/uL (ref 3.80–5.10)
RDW: 12 % (ref 11.0–15.0)
Total Lymphocyte: 34.8 %
WBC: 4.6 10*3/uL (ref 3.8–10.8)

## 2018-09-09 LAB — LIPID PANEL
Cholesterol: 169 mg/dL (ref <200)
HDL: 73 mg/dL (ref >=50)
LDL Cholesterol (Calc): 87 mg/dL (calc)
Non-HDL Cholesterol (Calc): 96 mg/dL (calc) (ref <130)
Total CHOL/HDL Ratio: 2.3 (calc) (ref <5.0)
Triglycerides: 30 mg/dL (ref <150)

## 2018-09-09 LAB — PAP IG W/ RFLX HPV ASCU

## 2018-09-09 LAB — ANTI-NUCLEAR AB-TITER (ANA TITER): ANA Titer 1: 1:160 {titer} — ABNORMAL HIGH

## 2018-09-09 LAB — TSH: TSH: 1.58 mIU/L

## 2018-09-09 LAB — ANA: Anti Nuclear Antibody (ANA): POSITIVE — AB

## 2018-09-09 LAB — VITAMIN D 25 HYDROXY (VIT D DEFICIENCY, FRACTURES): Vit D, 25-Hydroxy: 35 ng/mL (ref 30–100)

## 2018-09-10 ENCOUNTER — Encounter: Payer: Self-pay | Admitting: Gynecology

## 2018-09-10 NOTE — Telephone Encounter (Signed)
Lower number is better.  In the absence of other symptoms I think observation okay for now.  The question is when to repeat it.  I think as long as we are asymptomatic then repeating it in 1 year would be okay.

## 2018-10-01 ENCOUNTER — Encounter: Payer: Self-pay | Admitting: Gynecology

## 2018-11-08 ENCOUNTER — Other Ambulatory Visit: Payer: Self-pay

## 2018-11-08 DIAGNOSIS — Z20822 Contact with and (suspected) exposure to covid-19: Secondary | ICD-10-CM

## 2018-11-10 LAB — NOVEL CORONAVIRUS, NAA: SARS-CoV-2, NAA: NOT DETECTED

## 2019-01-14 ENCOUNTER — Other Ambulatory Visit: Payer: Self-pay

## 2019-01-14 MED ORDER — LEVOTHYROXINE SODIUM 125 MCG PO TABS: ORAL_TABLET | ORAL | 3 refills | Status: DC

## 2019-08-15 DIAGNOSIS — Z20822 Contact with and (suspected) exposure to covid-19: Secondary | ICD-10-CM | POA: Diagnosis not present

## 2019-08-15 DIAGNOSIS — J029 Acute pharyngitis, unspecified: Secondary | ICD-10-CM | POA: Diagnosis not present

## 2019-08-15 DIAGNOSIS — R509 Fever, unspecified: Secondary | ICD-10-CM | POA: Diagnosis not present

## 2019-08-19 ENCOUNTER — Other Ambulatory Visit: Payer: Self-pay | Admitting: Physician Assistant

## 2019-08-19 DIAGNOSIS — Z1231 Encounter for screening mammogram for malignant neoplasm of breast: Secondary | ICD-10-CM

## 2019-08-29 ENCOUNTER — Ambulatory Visit
Admission: RE | Admit: 2019-08-29 | Discharge: 2019-08-29 | Disposition: A | Discharge status: Home / Self Care | Payer: BC Managed Care – PPO | Source: Ambulatory Visit

## 2019-08-29 ENCOUNTER — Other Ambulatory Visit: Payer: Self-pay

## 2019-08-29 DIAGNOSIS — Z1231 Encounter for screening mammogram for malignant neoplasm of breast: Secondary | ICD-10-CM | POA: Diagnosis not present

## 2019-08-29 DIAGNOSIS — Z20822 Contact with and (suspected) exposure to covid-19: Secondary | ICD-10-CM | POA: Diagnosis not present

## 2019-09-16 ENCOUNTER — Encounter: Payer: BC Managed Care – PPO | Admitting: Nurse Practitioner

## 2019-09-29 ENCOUNTER — Other Ambulatory Visit: Payer: Self-pay

## 2019-09-29 ENCOUNTER — Encounter: Payer: Self-pay | Admitting: Nurse Practitioner

## 2019-09-29 ENCOUNTER — Ambulatory Visit (INDEPENDENT_AMBULATORY_CARE_PROVIDER_SITE_OTHER): Payer: BC Managed Care – PPO | Admitting: Nurse Practitioner

## 2019-09-29 VITALS — Ht 64.0 in | Wt 133.0 lb

## 2019-09-29 DIAGNOSIS — Z01419 Encounter for gynecological examination (general) (routine) without abnormal findings: Secondary | ICD-10-CM

## 2019-09-29 DIAGNOSIS — R5383 Other fatigue: Secondary | ICD-10-CM

## 2019-09-29 DIAGNOSIS — R6882 Decreased libido: Secondary | ICD-10-CM | POA: Diagnosis not present

## 2019-09-29 DIAGNOSIS — E559 Vitamin D deficiency, unspecified: Secondary | ICD-10-CM | POA: Diagnosis not present

## 2019-09-29 DIAGNOSIS — Z124 Encounter for screening for malignant neoplasm of cervix: Secondary | ICD-10-CM

## 2019-09-29 DIAGNOSIS — E039 Hypothyroidism, unspecified: Secondary | ICD-10-CM | POA: Diagnosis not present

## 2019-09-29 NOTE — Progress Notes (Signed)
   Madison [name] [facility] 1970-10-15 [id number]   History:  49 y.o. G0 presents for annual exam. 1994 CIN-1, subsequent normal until 08/2017 which showed ASCUS with negative HPV, most recent 1 year ago was normal. She complains of low libido, fatigue, and trouble losing weight. History of infertility, Vitamin D deficiency, and hypothyroidism. Positive ANA with no symptoms so not further workup was recommended. Normal mammogram history.   Gynecologic History Patient's last menstrual period was 09/17/2019. Period Cycle (Days): 28 Period Duration (Days): 5 Period Pattern: Regular Menstrual Flow: Light, Moderate Menstrual Control: Maxi pad, Tampon Dysmenorrhea: (!) Mild Dysmenorrhea Symptoms: Cramping Contraception: none Last Pap: 09/06/2018. Results were: normal Last mammogram: 09/01/2019. Results were: normal   Past medical history, past surgical history, family history and social history were all reviewed and documented in the EPIC chart.  ROS:  A ROS was performed and pertinent positives and negatives are included.  Exam:  Vitals:   09/29/19 1616  Weight: 133 lb (60.3 kg)  Height: 5\' 4"  (1.626 m)   Body mass index is 22.83 kg/m.  General appearance:  Normal Thyroid:  Symmetrical, normal in size, without palpable masses or nodularity. Respiratory  Auscultation:  Clear without wheezing or rhonchi Cardiovascular  Auscultation:  Regular rate. Murmur  Edema/varicosities:  Not grossly evident Abdominal  Soft,nontender, without masses, guarding or rebound.  Liver/spleen:  No organomegaly noted  Hernia:  None appreciated  Skin  Inspection:  Grossly normal   Breasts: Examined lying and sitting.   Right: Without masses, retractions, discharge or axillary adenopathy.   Left: Without masses, retractions, discharge or axillary adenopathy. Gentitourinary   Inguinal/mons:  Normal without inguinal adenopathy  External genitalia:  Normal  BUS/Urethra/Skene's glands:   Normal  Vagina:  Normal  Cervix:  Normal  Uterus:  Anteverted, normal in size, shape and contour.  Midline and mobile  Adnexa/parametria:     Rt: Without masses or tenderness.   Lt: Without masses or tenderness.  Anus and perineum: Normal   Assessment/Plan:  49 y.o. G0 for annual exam.   Well female exam with routine gynecological exam - Plan: CBC with Differential/Platelet, Comprehensive metabolic panel, Lipid panel. Education provided on SBEs, importance of preventative screenings, current guidelines, high calcium diet, regular exercise, and multivitamin daily.   Hypothyroidism, unspecified type - Plan: TSH.  Vitamin D deficiency - Plan: VITAMIN D 25 Hydroxy (Vit-D Deficiency, Fractures)  Low libido - Plan: Testos,Total,Free and SHBG (Female)  Fatigue - Plan: Testos,Total,Free and SHBG (Female), TSH  Encounter for Papanicolaou smear for cervical cancer screening - Plan: Pap IG w/ reflex to HPV when ASC-U. 1994 CIN-1, subsequent normal until 08/2017 which showed ASCUS with negative HPV, most recent 1 year ago was normal.   Follow up in 1 year for annual      09/2017 [facility], 4:28 PM 09/29/2019

## 2019-09-29 NOTE — Patient Instructions (Signed)
Health Maintenance, Female Adopting a healthy lifestyle and getting preventive care are important in promoting health and wellness. Ask your health care provider about:  The right schedule for you to have regular tests and exams.  Things you can do on your own to prevent diseases and keep yourself healthy. What should I know about diet, weight, and exercise? Eat a healthy diet   Eat a diet that includes plenty of vegetables, fruits, low-fat dairy products, and lean protein.  Do not eat a lot of foods that are high in solid fats, added sugars, or sodium. Maintain a healthy weight Body mass index (BMI) is used to identify weight problems. It estimates body fat based on height and weight. Your health care provider can help determine your BMI and help you achieve or maintain a healthy weight. Get regular exercise Get regular exercise. This is one of the most important things you can do for your health. Most adults should:  Exercise for at least 150 minutes each week. The exercise should increase your heart rate and make you sweat (moderate-intensity exercise).  Do strengthening exercises at least twice a week. This is in addition to the moderate-intensity exercise.  Spend less time sitting. Even light physical activity can be beneficial. Watch cholesterol and blood lipids Have your blood tested for lipids and cholesterol at 49 years of age, then have this test every 5 years. Have your cholesterol levels checked more often if:  Your lipid or cholesterol levels are high.  You are older than 49 years of age.  You are at high risk for heart disease. What should I know about cancer screening? Depending on your health history and family history, you may need to have cancer screening at various ages. This may include screening for:  Breast cancer.  Cervical cancer.  Colorectal cancer.  Skin cancer.  Lung cancer. What should I know about heart disease, diabetes, and high blood  pressure? Blood pressure and heart disease  High blood pressure causes heart disease and increases the risk of stroke. This is more likely to develop in people who have high blood pressure readings, are of African descent, or are overweight.  Have your blood pressure checked: ? Every 3-5 years if you are 18-39 years of age. ? Every year if you are 40 years old or older. Diabetes Have regular diabetes screenings. This checks your fasting blood sugar level. Have the screening done:  Once every three years after age 40 if you are at a normal weight and have a low risk for diabetes.  More often and at a younger age if you are overweight or have a high risk for diabetes. What should I know about preventing infection? Hepatitis B If you have a higher risk for hepatitis B, you should be screened for this virus. Talk with your health care provider to find out if you are at risk for hepatitis B infection. Hepatitis C Testing is recommended for:  Everyone born from 1945 through 1965.  Anyone with known risk factors for hepatitis C. Sexually transmitted infections (STIs)  Get screened for STIs, including gonorrhea and chlamydia, if: ? You are sexually active and are younger than 49 years of age. ? You are older than 49 years of age and your health care provider tells you that you are at risk for this type of infection. ? Your sexual activity has changed since you were last screened, and you are at increased risk for chlamydia or gonorrhea. Ask your health care provider if   you are at risk.  Ask your health care provider about whether you are at high risk for HIV. Your health care provider may recommend a prescription medicine to help prevent HIV infection. If you choose to take medicine to prevent HIV, you should first get tested for HIV. You should then be tested every 3 months for as long as you are taking the medicine. Pregnancy  If you are about to stop having your period (premenopausal) and  you may become pregnant, seek counseling before you get pregnant.  Take 400 to 800 micrograms (mcg) of folic acid every day if you become pregnant.  Ask for birth control (contraception) if you want to prevent pregnancy. Osteoporosis and menopause Osteoporosis is a disease in which the bones lose minerals and strength with aging. This can result in bone fractures. If you are 65 years old or older, or if you are at risk for osteoporosis and fractures, ask your health care provider if you should:  Be screened for bone loss.  Take a calcium or vitamin D supplement to lower your risk of fractures.  Be given hormone replacement therapy (HRT) to treat symptoms of menopause. Follow these instructions at home: Lifestyle  Do not use any products that contain nicotine or tobacco, such as cigarettes, e-cigarettes, and chewing tobacco. If you need help quitting, ask your health care provider.  Do not use street drugs.  Do not share needles.  Ask your health care provider for help if you need support or information about quitting drugs. Alcohol use  Do not drink alcohol if: ? Your health care provider tells you not to drink. ? You are pregnant, may be pregnant, or are planning to become pregnant.  If you drink alcohol: ? Limit how much you use to 0-1 drink a day. ? Limit intake if you are breastfeeding.  Be aware of how much alcohol is in your drink. In the [place] one drink equals one 12 oz bottle of beer (355 mL), one 5 oz glass of wine (148 mL), or one 1 oz glass of hard liquor (44 mL). General instructions  Schedule regular health, dental, and eye exams.  Stay current with your vaccines.  Tell your health care provider if: ? You often feel depressed. ? You have ever been abused or do not feel safe at home. Summary  Adopting a healthy lifestyle and getting preventive care are important in promoting health and wellness.  Follow your health care provider's instructions about healthy  diet, exercising, and getting tested or screened for diseases.  Follow your health care provider's instructions on monitoring your cholesterol and blood pressure. This information is not intended to replace advice given to you by your health care provider. Make sure you discuss any questions you have with your health care provider. Document Revised: 12/19/2017 Document Reviewed: 12/19/2017 Elsevier Patient Education  2020 Elsevier Inc.  

## 2019-09-30 ENCOUNTER — Other Ambulatory Visit: Payer: Self-pay | Admitting: Nurse Practitioner

## 2019-09-30 DIAGNOSIS — E039 Hypothyroidism, unspecified: Secondary | ICD-10-CM

## 2019-09-30 MED ORDER — LEVOTHYROXINE SODIUM 125 MCG PO TABS: ORAL_TABLET | ORAL | 1 refills | Status: DC

## 2019-10-01 LAB — PAP IG W/ RFLX HPV ASCU

## 2019-10-02 LAB — COMPREHENSIVE METABOLIC PANEL
AG Ratio: 2 (calc) (ref 1.0–2.5)
ALT: 14 U/L (ref 6–29)
AST: 16 U/L (ref 10–35)
Albumin: 4.9 g/dL (ref 3.6–5.1)
Alkaline phosphatase (APISO): 30 U/L — ABNORMAL LOW (ref 31–125)
BUN: 7 mg/dL (ref 7–25)
CO2: 26 mmol/L (ref 20–32)
Calcium: 9.6 mg/dL (ref 8.6–10.2)
Chloride: 101 mmol/L (ref 98–110)
Creat: 0.61 mg/dL (ref 0.50–1.10)
Globulin: 2.4 g/dL (calc) (ref 1.9–3.7)
Glucose, Bld: 86 mg/dL (ref 65–99)
Potassium: 4.3 mmol/L (ref 3.5–5.3)
Sodium: 137 mmol/L (ref 135–146)
Total Bilirubin: 0.9 mg/dL (ref 0.2–1.2)
Total Protein: 7.3 g/dL (ref 6.1–8.1)

## 2019-10-02 LAB — CBC WITH DIFFERENTIAL/PLATELET
Absolute Monocytes: 482 cells/uL (ref 200–950)
Basophils Absolute: 37 cells/uL (ref 0–200)
Basophils Relative: 0.5 %
Eosinophils Absolute: 88 cells/uL (ref 15–500)
Eosinophils Relative: 1.2 %
HCT: 38.8 % (ref 35.0–45.0)
Hemoglobin: 13.1 g/dL (ref 11.7–15.5)
Lymphs Abs: 2008 cells/uL (ref 850–3900)
MCH: 32 pg (ref 27.0–33.0)
MCHC: 33.8 g/dL (ref 32.0–36.0)
MCV: 94.9 fL (ref 80.0–100.0)
MPV: 10.4 fL (ref 7.5–12.5)
Monocytes Relative: 6.6 %
Neutro Abs: 4687 cells/uL (ref 1500–7800)
Neutrophils Relative %: 64.2 %
Platelets: 288 10*3/uL (ref 140–400)
RBC: 4.09 10*6/uL (ref 3.80–5.10)
RDW: 11.8 % (ref 11.0–15.0)
Total Lymphocyte: 27.5 %
WBC: 7.3 10*3/uL (ref 3.8–10.8)

## 2019-10-02 LAB — LIPID PANEL
Cholesterol: 176 mg/dL (ref <200)
HDL: 78 mg/dL (ref >=50)
LDL Cholesterol (Calc): 87 mg/dL (calc)
Non-HDL Cholesterol (Calc): 98 mg/dL (calc) (ref <130)
Total CHOL/HDL Ratio: 2.3 (calc) (ref <5.0)
Triglycerides: 38 mg/dL (ref <150)

## 2019-10-02 LAB — TESTOS,TOTAL,FREE AND SHBG (FEMALE)
Free Testosterone: 1.7 pg/mL (ref 0.1–6.4)
Sex Hormone Binding: 103 nmol/L (ref 17–124)
Testosterone, Total, LC-MS-MS: 30 ng/dL (ref 2–45)

## 2019-10-02 LAB — VITAMIN D 25 HYDROXY (VIT D DEFICIENCY, FRACTURES): Vit D, 25-Hydroxy: 32 ng/mL (ref 30–100)

## 2019-10-02 LAB — TSH: TSH: 2.39 mIU/L

## 2020-03-09 ENCOUNTER — Encounter: Payer: Self-pay | Admitting: Nurse Practitioner

## 2020-03-10 ENCOUNTER — Other Ambulatory Visit: Payer: Self-pay | Admitting: Nurse Practitioner

## 2020-03-10 DIAGNOSIS — R5383 Other fatigue: Secondary | ICD-10-CM

## 2020-03-10 DIAGNOSIS — R635 Abnormal weight gain: Secondary | ICD-10-CM

## 2020-03-10 DIAGNOSIS — E039 Hypothyroidism, unspecified: Secondary | ICD-10-CM

## 2020-03-11 DIAGNOSIS — M62838 Other muscle spasm: Secondary | ICD-10-CM | POA: Diagnosis not present

## 2020-03-11 DIAGNOSIS — M9902 Segmental and somatic dysfunction of thoracic region: Secondary | ICD-10-CM | POA: Diagnosis not present

## 2020-03-11 DIAGNOSIS — M9901 Segmental and somatic dysfunction of cervical region: Secondary | ICD-10-CM | POA: Diagnosis not present

## 2020-03-11 DIAGNOSIS — M542 Cervicalgia: Secondary | ICD-10-CM | POA: Diagnosis not present

## 2020-03-12 ENCOUNTER — Other Ambulatory Visit: Payer: Self-pay

## 2020-03-12 ENCOUNTER — Other Ambulatory Visit (INDEPENDENT_AMBULATORY_CARE_PROVIDER_SITE_OTHER): Payer: BC Managed Care – PPO

## 2020-03-12 DIAGNOSIS — R635 Abnormal weight gain: Secondary | ICD-10-CM | POA: Diagnosis not present

## 2020-03-12 DIAGNOSIS — E039 Hypothyroidism, unspecified: Secondary | ICD-10-CM | POA: Diagnosis not present

## 2020-03-12 DIAGNOSIS — R5383 Other fatigue: Secondary | ICD-10-CM | POA: Diagnosis not present

## 2020-03-13 ENCOUNTER — Encounter: Payer: Self-pay | Admitting: Nurse Practitioner

## 2020-03-13 LAB — THYROID PANEL WITH TSH
Free Thyroxine Index: 3.1 (ref 1.4–3.8)
T3 Uptake: 36 % — ABNORMAL HIGH (ref 22–35)
T4, Total: 8.7 ug/dL (ref 5.1–11.9)
TSH: 3.37 mIU/L

## 2020-03-15 ENCOUNTER — Other Ambulatory Visit: Payer: Self-pay | Admitting: Nurse Practitioner

## 2020-03-15 ENCOUNTER — Encounter: Payer: Self-pay | Admitting: Nurse Practitioner

## 2020-03-15 DIAGNOSIS — E039 Hypothyroidism, unspecified: Secondary | ICD-10-CM

## 2020-03-15 MED ORDER — LEVOTHYROXINE SODIUM 137 MCG PO TABS: ORAL_TABLET | ORAL | 1 refills | Status: DC

## 2020-03-15 NOTE — Progress Notes (Signed)
Patient currently taking Synthroid 125 mcg for hypothyroid management. She reports feeling much better when her TSH is on the lower end of normal. She asked for an adjustment to help with this. TSH recheck on 03/12/2020 was 3.37. We will do 125 mcg Tuesday, Thursday, Saturday, Sunday alternating with 137 mcg Monday, Wednesday, Friday. I also recommended endocrinology referral. She would like to try this adjustment first and then will consider referral. She will return in 8 weeks for TSH recheck.

## 2020-03-25 DIAGNOSIS — M9902 Segmental and somatic dysfunction of thoracic region: Secondary | ICD-10-CM | POA: Diagnosis not present

## 2020-03-25 DIAGNOSIS — M542 Cervicalgia: Secondary | ICD-10-CM | POA: Diagnosis not present

## 2020-03-25 DIAGNOSIS — M9901 Segmental and somatic dysfunction of cervical region: Secondary | ICD-10-CM | POA: Diagnosis not present

## 2020-03-25 DIAGNOSIS — M62838 Other muscle spasm: Secondary | ICD-10-CM | POA: Diagnosis not present

## 2020-04-22 ENCOUNTER — Other Ambulatory Visit: Payer: Self-pay | Admitting: Nurse Practitioner

## 2020-04-22 ENCOUNTER — Telehealth: Payer: Self-pay | Admitting: *Deleted

## 2020-04-22 DIAGNOSIS — E039 Hypothyroidism, unspecified: Secondary | ICD-10-CM

## 2020-04-22 NOTE — Telephone Encounter (Signed)
Patient was told to repeat TSH level in 8 weeks, patient said she will be travel out of town for the next 2 weeks. She asked if you thought coming to have it recheck today would be okay?

## 2020-04-22 NOTE — Telephone Encounter (Signed)
Yes that is fine. I will place the lab order.

## 2020-04-22 NOTE — Telephone Encounter (Signed)
Patient informed she can come Monday 04/26/20 @ 9:00am lab appointment made

## 2020-04-26 ENCOUNTER — Other Ambulatory Visit: Payer: Self-pay

## 2020-04-26 ENCOUNTER — Other Ambulatory Visit: Payer: Self-pay | Admitting: Nurse Practitioner

## 2020-04-26 ENCOUNTER — Other Ambulatory Visit (INDEPENDENT_AMBULATORY_CARE_PROVIDER_SITE_OTHER): Payer: BC Managed Care – PPO

## 2020-04-26 DIAGNOSIS — E039 Hypothyroidism, unspecified: Secondary | ICD-10-CM | POA: Diagnosis not present

## 2020-04-26 LAB — TSH: TSH: 5.24 mIU/L — ABNORMAL HIGH

## 2020-04-27 ENCOUNTER — Encounter: Payer: Self-pay | Admitting: Nurse Practitioner

## 2020-04-27 NOTE — Telephone Encounter (Signed)
Medication refill request: Synthroid Last AEX:  09-29-19 TW Next AEX: not currently scheduled  Last TSH: 04-26-20 (5.24) Refill authorized: Today, please advise.   Medication pended for #90, 1RF. Please refill if appropriate.

## 2020-04-29 ENCOUNTER — Telehealth: Payer: Self-pay | Admitting: *Deleted

## 2020-04-29 ENCOUNTER — Other Ambulatory Visit: Payer: Self-pay | Admitting: Nurse Practitioner

## 2020-04-29 DIAGNOSIS — E039 Hypothyroidism, unspecified: Secondary | ICD-10-CM

## 2020-04-29 MED ORDER — LEVOTHYROXINE SODIUM 125 MCG PO TABS
ORAL_TABLET | ORAL | 1 refills | Status: DC
Start: 2020-04-29 — End: 2021-05-19

## 2020-04-29 NOTE — Telephone Encounter (Signed)
Office notes faxed to [phone], they will call patient to schedule or patient can call to schedule. My chart message sent.

## 2020-04-29 NOTE — Telephone Encounter (Signed)
Oh okay! I will do. Thank you.

## 2020-04-29 NOTE — Telephone Encounter (Signed)
-----   Message from Olivia Mackie, NP sent at 04/29/2020  8:44 AM EDT ----- Please send referral to endocrinology for hypothyroidism. She is requesting referral for Barbie Haggis, MD, MPH with [facility] in [place]. Thank you!

## 2020-04-29 NOTE — Telephone Encounter (Signed)
I am trying to send a refill to the requested pharmacy (she reached out via mychart to request [facility] at [place]) but it will not let me send due to automatic [facility]ills [facility]?

## 2020-04-29 NOTE — Telephone Encounter (Signed)
So [facility] actually sent [place] that electronic request. So we will have to deny it to [facility] and represcribe it to [facility].  Cannot use [facility] electronic message for [facility].   I am happy to do this if you tell me quantity and refills.  Thanks

## 2020-05-07 NOTE — Telephone Encounter (Signed)
Patient called back stating the below office can't see her until August. She is now scheduled with Dr.Rao on 05/10/20 @ 10:40am office notes faxed to [phone] provided by patient.

## 2020-05-10 DIAGNOSIS — E039 Hypothyroidism, unspecified: Secondary | ICD-10-CM | POA: Diagnosis not present

## 2020-05-10 DIAGNOSIS — E063 Autoimmune thyroiditis: Secondary | ICD-10-CM | POA: Diagnosis not present

## 2020-09-07 ENCOUNTER — Other Ambulatory Visit: Payer: Self-pay | Admitting: Nurse Practitioner

## 2020-09-07 DIAGNOSIS — Z1231 Encounter for screening mammogram for malignant neoplasm of breast: Secondary | ICD-10-CM

## 2020-09-24 ENCOUNTER — Ambulatory Visit
Admission: RE | Admit: 2020-09-24 | Discharge: 2020-09-24 | Disposition: A | Discharge status: Home / Self Care | Payer: BC Managed Care – PPO | Source: Ambulatory Visit

## 2020-09-24 ENCOUNTER — Other Ambulatory Visit: Payer: Self-pay

## 2020-09-24 DIAGNOSIS — Z1231 Encounter for screening mammogram for malignant neoplasm of breast: Secondary | ICD-10-CM | POA: Diagnosis not present

## 2020-11-19 DIAGNOSIS — E039 Hypothyroidism, unspecified: Secondary | ICD-10-CM | POA: Diagnosis not present

## 2021-05-19 ENCOUNTER — Ambulatory Visit (INDEPENDENT_AMBULATORY_CARE_PROVIDER_SITE_OTHER): Payer: BC Managed Care – PPO | Admitting: Nurse Practitioner

## 2021-05-19 ENCOUNTER — Encounter: Payer: Self-pay | Admitting: Nurse Practitioner

## 2021-05-19 VITALS — BP 118/74 | Ht 63.5 in | Wt 132.0 lb

## 2021-05-19 DIAGNOSIS — E039 Hypothyroidism, unspecified: Secondary | ICD-10-CM

## 2021-05-19 DIAGNOSIS — Z01419 Encounter for gynecological examination (general) (routine) without abnormal findings: Secondary | ICD-10-CM

## 2021-05-19 NOTE — Progress Notes (Signed)
? ?  Madison [name] [facility] 20-Oct-1970 [id number] ? ? ?History:  51 y.o. G0 presents for annual exam. Monthly cycles. 1994 CIN-1, subsequent normal until 08/2017 which showed ASCUS with negative HPV. History of infertility, Vitamin D deficiency, and hypothyroidism (managed by endo). Had to cancel appointment with endo and would like TSH checked today. Positive ANA with no symptoms so not further workup was recommended. Normal mammogram history.  ? ?Gynecologic History ?Patient's last menstrual period was 05/15/2021. ?Period Cycle (Days): 28 ?Period Duration (Days): 5 ?Period Pattern: Regular ?Menstrual Flow: Moderate ?Dysmenorrhea: (!) Mild ?Dysmenorrhea Symptoms: Cramping ?Contraception: none ?Sexually active: Yes ? ?Health maintenance ?Last Pap: 09/29/2019. Results were: Normal, 3-year repeat ?Last mammogram: 09/24/2020. Results were: Normal ?Last Colonoscopy: Never ?Last Dexa: Not indicated ? ? ?Past medical history, past surgical history, family history and social history were all reviewed and documented in the EPIC chart. 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  ? ?ROS:  A ROS was performed and pertinent positives and negatives are included. ? ?Exam: ? ?Vitals:  ? 05/19/21 1324  ?BP: 118/74  ?Weight: 132 lb (59.9 kg)  ?Height: 5' 3.5" (1.613 m)  ? ? ?Body mass index is 23.02 kg/m?. ? ?General appearance:  Normal ?Thyroid:  Symmetrical, normal in size, without palpable masses or nodularity. ?Respiratory ? Auscultation:  Clear without wheezing or rhonchi ?Cardiovascular ? Auscultation:  Regular rate. Murmur ? Edema/varicosities:  Not grossly evident ?Abdominal ? Soft,nontender, without masses, guarding or rebound. ? Liver/spleen:  No organomegaly noted ? Hernia:  None appreciated ? Skin ? Inspection:  Grossly normal ?  ?Breasts: Examined lying and sitting.  ? Right: Without masses, retractions, discharge or axillary adenopathy. ? ? Left: Without masses, retractions, discharge or  axillary adenopathy. ?Genitourinary  ? Inguinal/mons:  Normal without inguinal adenopathy ? External genitalia:  Normal appearing vulva with no masses, tenderness, or lesions ? BUS/Urethra/Skene's glands:  Normal ? Vagina:  Normal appearing with normal color and discharge, no lesions ? Cervix:  Normal appearing without discharge or lesions ? Uterus:  Normal in size, shape and contour.  Midline and mobile, nontender ? Adnexa/parametria:   ?  Rt: Normal in size, without masses or tenderness. ?  Lt: Normal in size, without masses or tenderness. ? Anus and perineum: Normal ? Digital rectal exam: Normal sphincter tone without palpated masses or tenderness ? ?Patient informed chaperone available to be present for breast and pelvic exam. Patient has requested no chaperone to be present. Patient has been advised what will be completed during breast and pelvic exam.  ? ? ?Assessment/Plan:  51 y.o. G0 for annual exam.  ? ?Well female exam with routine gynecological exam - Plan: CBC with Differential/Platelet, Comprehensive metabolic panel, Lipid panel. Education provided on SBEs, importance of preventative screenings, current guidelines, high calcium diet, regular exercise, and multivitamin daily.   ? ?Hypothyroidism, unspecified type - Plan: TSH. Managed by endo but had to cancel last appt and would like checked today.  ? ?Screening for cervical cancer - 1994 CIN-1, subsequent normal until 08/2017 which showed ASCUS with negative HPV. Will repeat at 3-year interval per guidelines. ? ?Screening for breast cancer - Normal mammogram history.  Continue annual screenings.  Normal breast exam today. ? ?Screening for colon cancer - Discussed current guidelines and importance of preventative screenings. Information provided on [facility].  ? ?Follow up in 1 year for annual. ? ? ? ? ?Olivia Mackie [facility], 1:52 PM 05/19/2021 ?

## 2021-05-19 NOTE — Patient Instructions (Signed)
Schedule Colonoscopy! ? GI ?[phone] ?[address] [place], [place] [address] ? ?

## 2021-05-20 LAB — CBC WITH DIFFERENTIAL/PLATELET
Absolute Monocytes: 552 cells/uL (ref 200–950)
Basophils Absolute: 50 cells/uL (ref 0–200)
Basophils Relative: 0.8 %
Eosinophils Absolute: 161 cells/uL (ref 15–500)
Eosinophils Relative: 2.6 %
HCT: 39.6 % (ref 35.0–45.0)
Hemoglobin: 13 g/dL (ref 11.7–15.5)
Lymphs Abs: 2195 cells/uL (ref 850–3900)
MCH: 31.6 pg (ref 27.0–33.0)
MCHC: 32.8 g/dL (ref 32.0–36.0)
MCV: 96.4 fL (ref 80.0–100.0)
MPV: 10.6 fL (ref 7.5–12.5)
Monocytes Relative: 8.9 %
Neutro Abs: 3243 cells/uL (ref 1500–7800)
Neutrophils Relative %: 52.3 %
Platelets: 290 10*3/uL (ref 140–400)
RBC: 4.11 10*6/uL (ref 3.80–5.10)
RDW: 12.6 % (ref 11.0–15.0)
Total Lymphocyte: 35.4 %
WBC: 6.2 10*3/uL (ref 3.8–10.8)

## 2021-05-20 LAB — TSH: TSH: 1.92 mIU/L

## 2021-05-20 LAB — COMPREHENSIVE METABOLIC PANEL
AG Ratio: 1.8 (calc) (ref 1.0–2.5)
ALT: 16 U/L (ref 6–29)
AST: 17 U/L (ref 10–35)
Albumin: 5 g/dL (ref 3.6–5.1)
Alkaline phosphatase (APISO): 46 U/L (ref 37–153)
BUN: 14 mg/dL (ref 7–25)
CO2: 27 mmol/L (ref 20–32)
Calcium: 10 mg/dL (ref 8.6–10.4)
Chloride: 101 mmol/L (ref 98–110)
Creat: 0.64 mg/dL (ref 0.50–1.03)
Globulin: 2.8 g/dL (calc) (ref 1.9–3.7)
Glucose, Bld: 92 mg/dL (ref 65–99)
Potassium: 4.5 mmol/L (ref 3.5–5.3)
Sodium: 138 mmol/L (ref 135–146)
Total Bilirubin: 0.7 mg/dL (ref 0.2–1.2)
Total Protein: 7.8 g/dL (ref 6.1–8.1)

## 2021-05-20 LAB — LIPID PANEL
Cholesterol: 184 mg/dL (ref <200)
HDL: 87 mg/dL (ref >=50)
LDL Cholesterol (Calc): 87 mg/dL (calc)
Non-HDL Cholesterol (Calc): 97 mg/dL (calc) (ref <130)
Total CHOL/HDL Ratio: 2.1 (calc) (ref <5.0)
Triglycerides: 36 mg/dL (ref <150)

## 2021-08-11 ENCOUNTER — Other Ambulatory Visit: Payer: Self-pay | Admitting: Nurse Practitioner

## 2021-08-11 DIAGNOSIS — Z1231 Encounter for screening mammogram for malignant neoplasm of breast: Secondary | ICD-10-CM

## 2021-09-30 ENCOUNTER — Ambulatory Visit
Admission: RE | Admit: 2021-09-30 | Discharge: 2021-09-30 | Disposition: A | Discharge status: Home / Self Care | Payer: BC Managed Care – PPO | Source: Ambulatory Visit

## 2021-09-30 DIAGNOSIS — Z1231 Encounter for screening mammogram for malignant neoplasm of breast: Secondary | ICD-10-CM | POA: Diagnosis not present

## 2021-10-28 ENCOUNTER — Telehealth: Payer: Self-pay | Admitting: *Deleted

## 2021-10-28 NOTE — Telephone Encounter (Signed)
Patient called stating she needs refill on Brand synthroid 112 mcg tablet. Patient endocrinologist is no longer at [facility] ( reports she didn't realized until she called) patient said you use to management her thyroid when it was stable. Last TSH level checked here and normal. She asked since TSH is now stable would you feel comfortable managing? If yes, she uses [facility] in [place] and will provide me with that information with call back. Please advise

## 2021-10-31 ENCOUNTER — Other Ambulatory Visit: Payer: Self-pay | Admitting: Nurse Practitioner

## 2021-10-31 DIAGNOSIS — E039 Hypothyroidism, unspecified: Secondary | ICD-10-CM

## 2021-10-31 MED ORDER — SYNTHROID 112 MCG PO TABS: 112.0000 ug | ORAL_TABLET | Freq: Every day | ORAL | 1 refills | Status: DC

## 2021-10-31 NOTE — Telephone Encounter (Signed)
Left detailed message on voicemail Rx has been sent

## 2021-10-31 NOTE — Telephone Encounter (Signed)
Yes, I am fine to manage. Will send refills over. Thanks.

## 2022-03-21 IMAGING — MG DIGITAL SCREENING BILAT W/ TOMO W/ CAD
8 series · 9 of 24 positions shown · non-contrast
Comparison: Previous exam(s).

CLINICAL DATA: Screening.

EXAM:
DIGITAL SCREENING BILATERAL MAMMOGRAM WITH TOMO AND CAD

[R CC synth-2D]
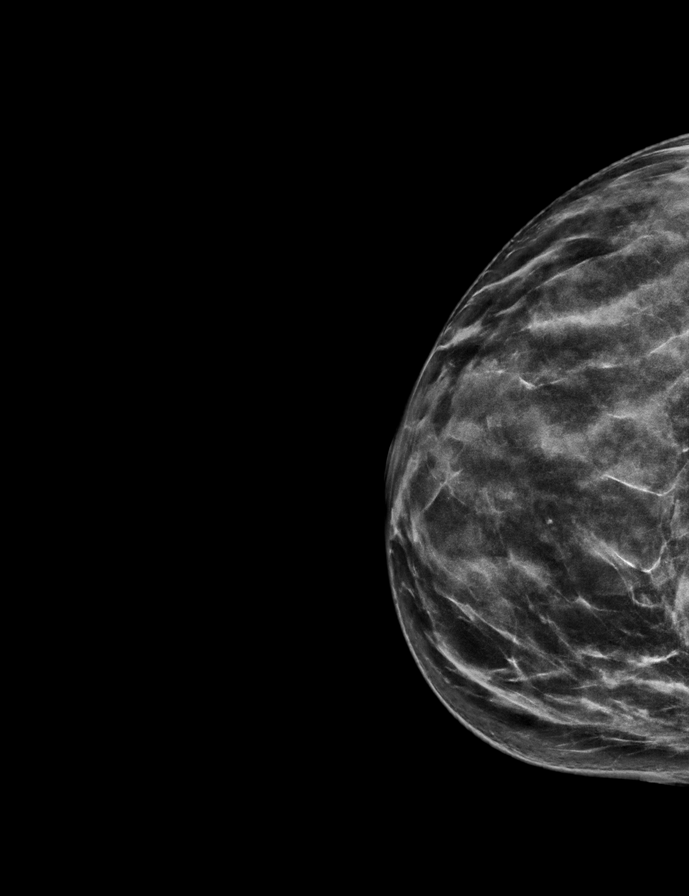

[L MLO synth-2D]
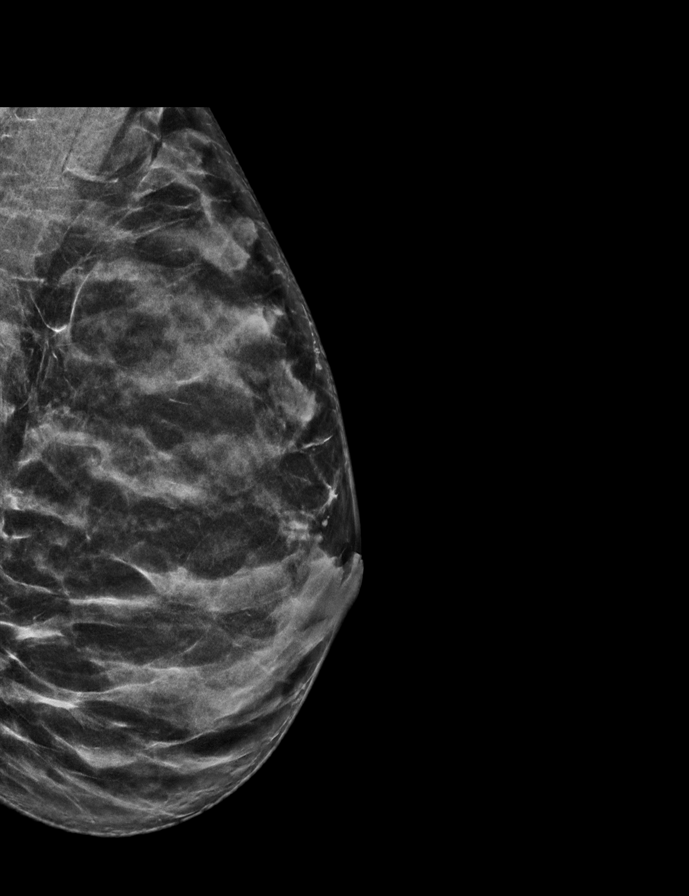

[R MLO synth-2D]
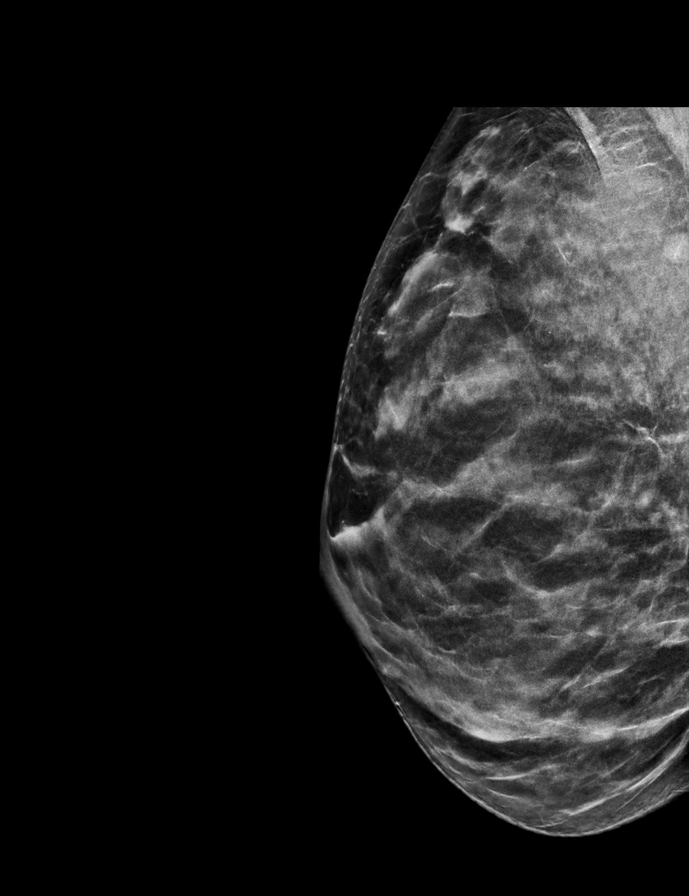

[L CC synth-2D]
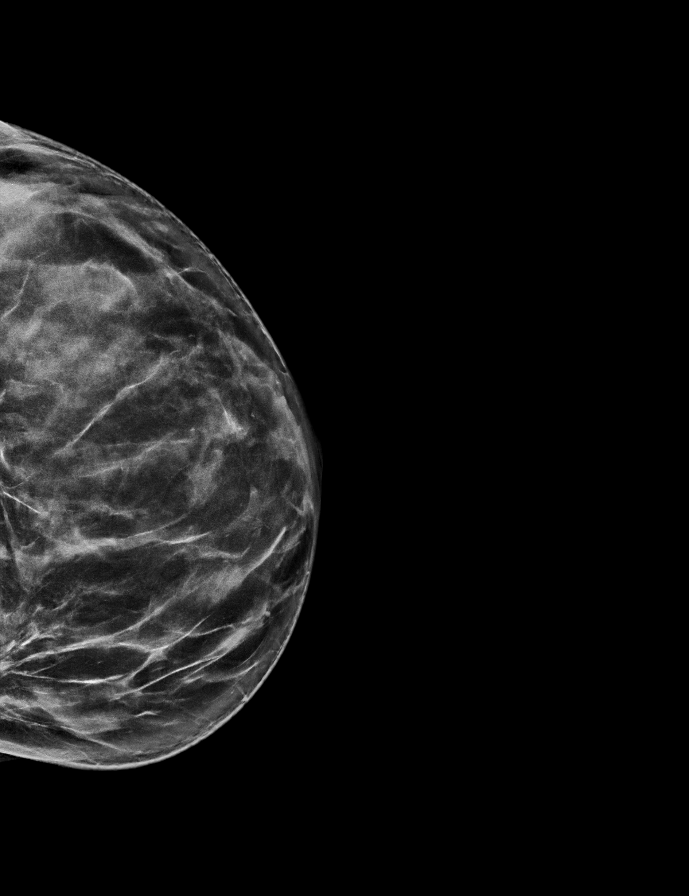

[R MLO tomo · 2 of 63 frames shown]
[frame 21/63]
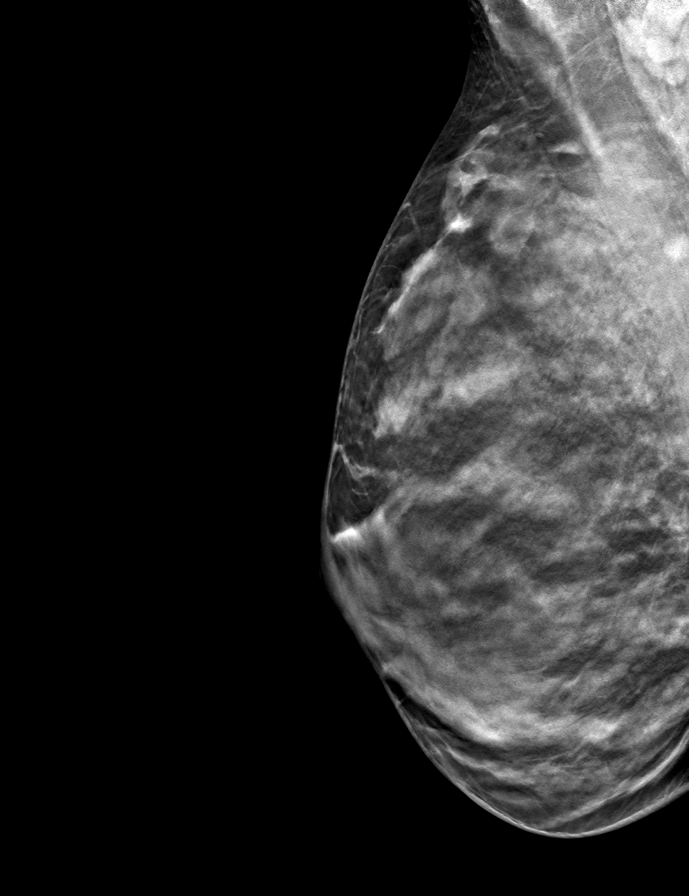
[frame 32/63]
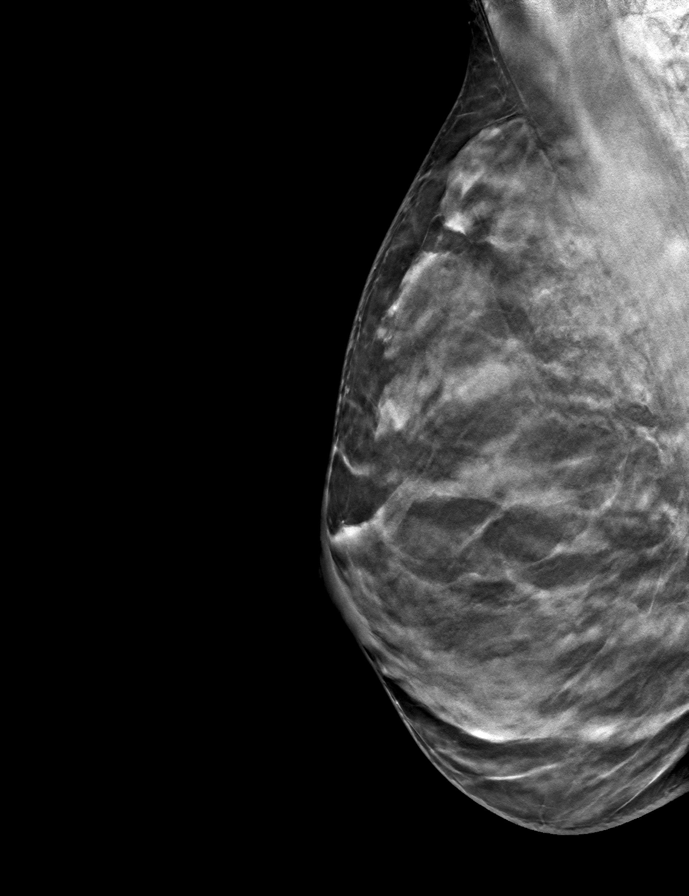

[L CC tomo · tomo slice 31/62.0]
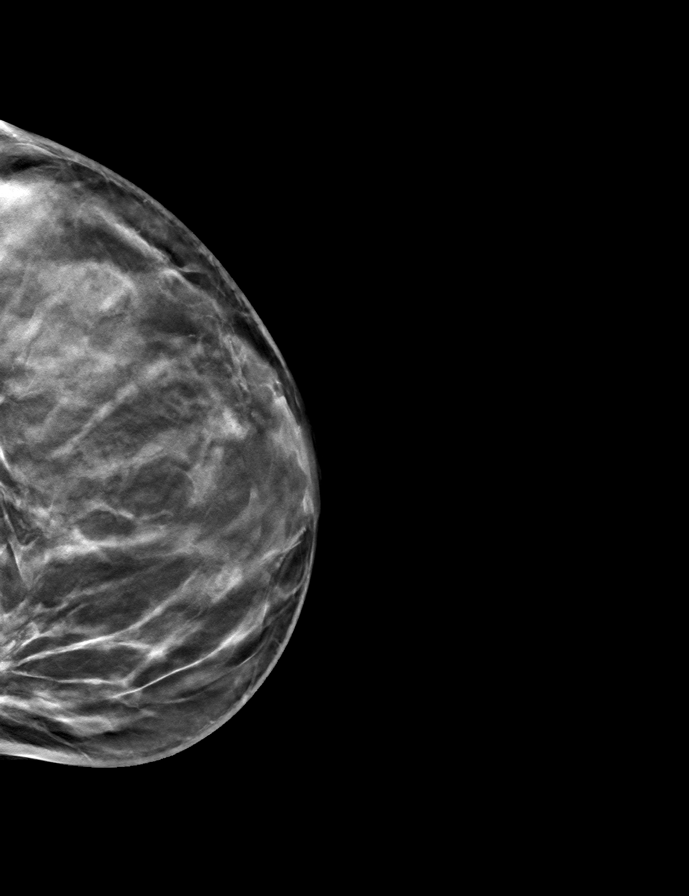

[L MLO tomo · tomo slice 30/59.0]
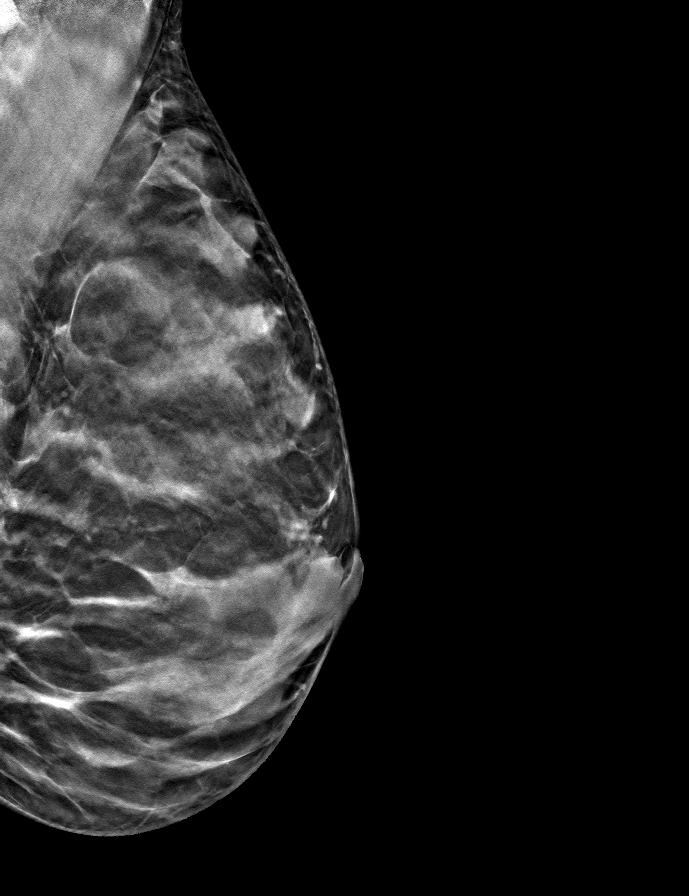

[R CC tomo · tomo slice 33/64.0]
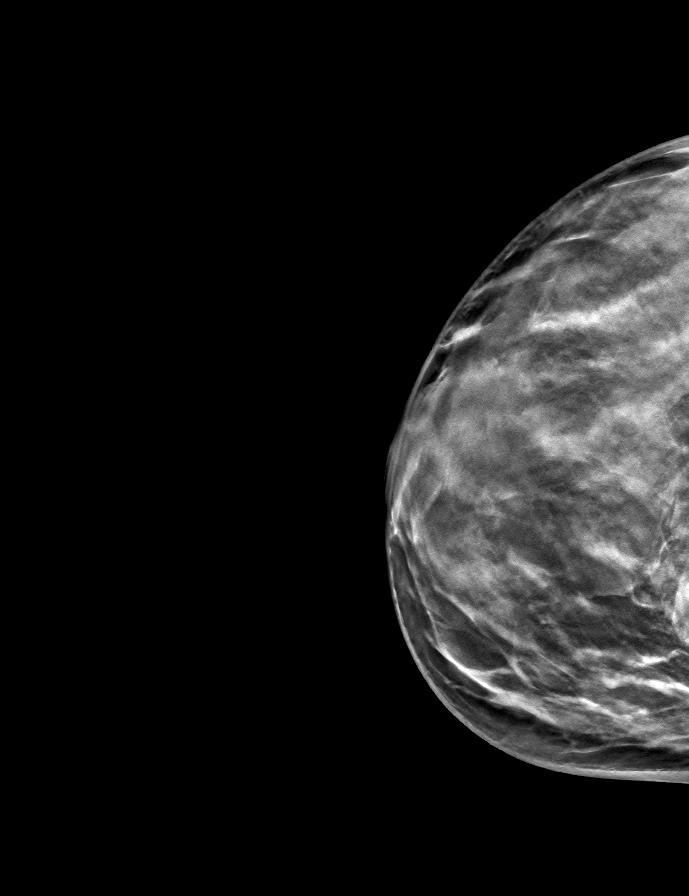

[9 of 24 positions shown; findings below may reference images not displayed]

ACR Breast Density Category d: The breast tissue is extremely dense,
which lowers the sensitivity of mammography
FINDINGS: There are no findings suspicious for malignancy. Images were
processed with CAD.
IMPRESSION: No mammographic evidence of malignancy. A result letter of this
screening mammogram will be mailed directly to the patient.

RECOMMENDATION:
Screening mammogram in one year. (Code:WO-0-ZI0)

BI-RADS CATEGORY  1: Negative.

## 2022-03-31 ENCOUNTER — Other Ambulatory Visit: Payer: Self-pay | Admitting: Nurse Practitioner

## 2022-03-31 DIAGNOSIS — E039 Hypothyroidism, unspecified: Secondary | ICD-10-CM

## 2022-03-31 NOTE — Telephone Encounter (Signed)
Med refill request: Synthroid Last AEX: 05/19/21 Next AEX: not scheduled yet Last MMG (if hormonal med) 09/30/21 Refill authorized: Please Advise?

## 2022-06-01 ENCOUNTER — Ambulatory Visit: Payer: BC Managed Care – PPO | Admitting: Nurse Practitioner

## 2022-08-02 ENCOUNTER — Ambulatory Visit: Payer: BC Managed Care – PPO | Admitting: Nurse Practitioner

## 2022-09-08 ENCOUNTER — Other Ambulatory Visit: Payer: Self-pay | Admitting: Nurse Practitioner

## 2022-09-08 DIAGNOSIS — Z1231 Encounter for screening mammogram for malignant neoplasm of breast: Secondary | ICD-10-CM

## 2022-09-20 ENCOUNTER — Other Ambulatory Visit (HOSPITAL_COMMUNITY)
Admission: RE | Admit: 2022-09-20 | Discharge: 2022-09-20 | Disposition: A | Discharge status: Home / Self Care | Payer: BC Managed Care – PPO | Source: Ambulatory Visit | Attending: Nurse Practitioner | Admitting: Nurse Practitioner

## 2022-09-20 ENCOUNTER — Encounter: Payer: Self-pay | Admitting: Nurse Practitioner

## 2022-09-20 ENCOUNTER — Ambulatory Visit (INDEPENDENT_AMBULATORY_CARE_PROVIDER_SITE_OTHER): Payer: BC Managed Care – PPO | Admitting: Nurse Practitioner

## 2022-09-20 VITALS — BP 126/84 | HR 75 | Ht 63.78 in | Wt 126.4 lb

## 2022-09-20 DIAGNOSIS — Z124 Encounter for screening for malignant neoplasm of cervix: Secondary | ICD-10-CM

## 2022-09-20 DIAGNOSIS — E039 Hypothyroidism, unspecified: Secondary | ICD-10-CM | POA: Diagnosis not present

## 2022-09-20 DIAGNOSIS — N951 Menopausal and female climacteric states: Secondary | ICD-10-CM | POA: Diagnosis not present

## 2022-09-20 DIAGNOSIS — E559 Vitamin D deficiency, unspecified: Secondary | ICD-10-CM

## 2022-09-20 DIAGNOSIS — Z01419 Encounter for gynecological examination (general) (routine) without abnormal findings: Secondary | ICD-10-CM

## 2022-09-20 NOTE — Progress Notes (Signed)
Neisha Reames [facility] [birth year] [id number]   History:  52 y.o. G0 presents for annual exam. Cycles have become irregular. Denies menopausal symptoms. 1994 CIN-1, subsequent normal until 08/2017 which showed ASCUS with negative HPV. History of infertility, Vitamin D deficiency, and hypothyroidism previously managed by endo but would like done here. Positive ANA with no symptoms so not further workup was recommended. Normal mammogram history.   Gynecologic History Patient's last menstrual period was 08/05/2022. Period Duration (Days): 3-5 Period Pattern: (!) Irregular Menstrual Flow: Heavy Menstrual Control: Tampon, Maxi pad Menstrual Control Change Freq (Hours): 3 Dysmenorrhea: (!) Severe Dysmenorrhea Symptoms: Cramping Contraception: none Sexually active: Yes  Health maintenance Last Pap: 09/29/2019. Results were: Normal, 3-year repeat Last mammogram: 09/24/2020. Results were: Normal Last Colonoscopy: Never Last Dexa: Not indicated  Past medical history, past surgical history, family history and social history were all reviewed and documented in the EPIC chart. 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   ROS:  A ROS was performed and pertinent positives and negatives are included.  Exam:  Vitals:   09/20/22 1134  BP: 126/84  Pulse: 75  SpO2: 98%  Weight: 126 lb 6.4 oz (57.3 kg)  Height: 5' 3.78" (1.62 m)     Body mass index is 21.85 kg/m.  General appearance:  Normal Thyroid:  Symmetrical, normal in size, without palpable masses or nodularity. Respiratory  Auscultation:  Clear without wheezing or rhonchi Cardiovascular  Auscultation:  Regular rate. Murmur  Edema/varicosities:  Not grossly evident Abdominal  Soft,nontender, without masses, guarding or rebound.  Liver/spleen:  No organomegaly noted  Hernia:  None appreciated  Skin  Inspection:  Grossly normal   Breasts: Examined lying and sitting.   Right: Without masses,  retractions, discharge or axillary adenopathy.   Left: Without masses, retractions, discharge or axillary adenopathy. Genitourinary   Inguinal/mons:  Normal without inguinal adenopathy  External genitalia:  Normal appearing vulva with no masses, tenderness, or lesions  BUS/Urethra/Skene's glands:  Normal  Vagina:  Normal appearing with normal color and discharge, no lesions  Cervix:  Normal appearing without discharge or lesions  Uterus:  Normal in size, shape and contour.  Midline and mobile, nontender  Adnexa/parametria:     Rt: Normal in size, without masses or tenderness.   Lt: Normal in size, without masses or tenderness.  Anus and perineum: Normal  Digital rectal exam: Not indicated  Patient informed chaperone available to be present for breast and pelvic exam. Patient has requested no chaperone to be present. Patient has been advised what will be completed during breast and pelvic exam.    Assessment/Plan:  52 y.o. G0 for annual exam.   Well female exam with routine gynecological exam - Plan: CBC with Differential/Platelet, Comprehensive metabolic panel, lipid panel. Education provided on SBEs, importance of preventative screenings, current guidelines, high calcium diet, regular exercise, and multivitamin daily.   Perimenopause - cycles have become irregular. Denies menopausal symptoms. Discussed what to expect.   Screening for cervical cancer - Plan: Cytology - PAP( [facility]). 1994 CIN-1, subsequent normal until 08/2017 which showed ASCUS with negative HPV.  Hypothyroidism, unspecified type - Plan: TSH. Previously managed by endo. Has been euthyroid for a while. Would like managed here. Takes name brand Synthroid.   Vitamin D deficiency - Plan: VITAMIN D 25 Hydroxy (Vit-D Deficiency, Fractures)  Screening for breast cancer - Normal mammogram history.  Continue annual screenings.  Normal breast exam today.  Screening for colon cancer - Discussed current guidelines  and  importance of preventative screenings. Information provided on [facility].   Follow up in 1 year for annual.     Olivia Mackie [facility], 11:41 AM 09/20/2022

## 2022-09-21 ENCOUNTER — Other Ambulatory Visit: Payer: Self-pay | Admitting: Nurse Practitioner

## 2022-09-21 ENCOUNTER — Encounter: Payer: Self-pay | Admitting: Nurse Practitioner

## 2022-09-21 DIAGNOSIS — E039 Hypothyroidism, unspecified: Secondary | ICD-10-CM

## 2022-09-21 LAB — COMPREHENSIVE METABOLIC PANEL
AG Ratio: 1.9 (calc) (ref 1.0–2.5)
ALT: 15 U/L (ref 6–29)
AST: 16 U/L (ref 10–35)
Albumin: 5 g/dL (ref 3.6–5.1)
Alkaline phosphatase (APISO): 34 U/L — ABNORMAL LOW (ref 37–153)
BUN: 11 mg/dL (ref 7–25)
CO2: 24 mmol/L (ref 20–32)
Calcium: 9.8 mg/dL (ref 8.6–10.4)
Chloride: 102 mmol/L (ref 98–110)
Creat: 0.68 mg/dL (ref 0.50–1.03)
Globulin: 2.7 g/dL (ref 1.9–3.7)
Glucose, Bld: 96 mg/dL (ref 65–99)
Potassium: 4.2 mmol/L (ref 3.5–5.3)
Sodium: 138 mmol/L (ref 135–146)
Total Bilirubin: 0.5 mg/dL (ref 0.2–1.2)
Total Protein: 7.7 g/dL (ref 6.1–8.1)

## 2022-09-21 LAB — CBC WITH DIFFERENTIAL/PLATELET
Absolute Monocytes: 399 {cells}/uL (ref 200–950)
Basophils Absolute: 59 {cells}/uL (ref 0–200)
Basophils Relative: 1.4 %
Eosinophils Absolute: 71 {cells}/uL (ref 15–500)
Eosinophils Relative: 1.7 %
HCT: 40.8 % (ref 35.0–45.0)
Hemoglobin: 13.4 g/dL (ref 11.7–15.5)
Lymphs Abs: 1541 {cells}/uL (ref 850–3900)
MCH: 31.7 pg (ref 27.0–33.0)
MCHC: 32.8 g/dL (ref 32.0–36.0)
MCV: 96.5 fL (ref 80.0–100.0)
MPV: 10.5 fL (ref 7.5–12.5)
Monocytes Relative: 9.5 %
Neutro Abs: 2129 {cells}/uL (ref 1500–7800)
Neutrophils Relative %: 50.7 %
Platelets: 272 10*3/uL (ref 140–400)
RBC: 4.23 10*6/uL (ref 3.80–5.10)
RDW: 11.9 % (ref 11.0–15.0)
Total Lymphocyte: 36.7 %
WBC: 4.2 10*3/uL (ref 3.8–10.8)

## 2022-09-21 LAB — LIPID PANEL
Cholesterol: 195 mg/dL (ref <200)
HDL: 91 mg/dL (ref >=50)
LDL Cholesterol (Calc): 95 mg/dL
Non-HDL Cholesterol (Calc): 104 mg/dL (ref <130)
Total CHOL/HDL Ratio: 2.1 (calc) (ref <5.0)
Triglycerides: 29 mg/dL (ref <150)

## 2022-09-21 LAB — TSH: TSH: 3.54 m[IU]/L

## 2022-09-21 LAB — VITAMIN D 25 HYDROXY (VIT D DEFICIENCY, FRACTURES): Vit D, 25-Hydroxy: 29 ng/mL — ABNORMAL LOW (ref 30–100)

## 2022-09-21 LAB — CYTOLOGY - PAP
Adequacy: ABSENT
Comment: NEGATIVE
Diagnosis: NEGATIVE
High risk HPV: NEGATIVE

## 2022-09-21 MED ORDER — SYNTHROID 112 MCG PO TABS: 112.0000 ug | ORAL_TABLET | Freq: Every day | ORAL | 3 refills | Status: DC

## 2022-09-26 ENCOUNTER — Other Ambulatory Visit: Payer: Self-pay | Admitting: Nurse Practitioner

## 2022-09-26 DIAGNOSIS — Z1231 Encounter for screening mammogram for malignant neoplasm of breast: Secondary | ICD-10-CM

## 2022-10-06 ENCOUNTER — Ambulatory Visit
Admission: RE | Admit: 2022-10-06 | Discharge: 2022-10-06 | Disposition: A | Discharge status: Home / Self Care | Payer: BC Managed Care – PPO | Source: Ambulatory Visit

## 2022-10-06 DIAGNOSIS — Z1231 Encounter for screening mammogram for malignant neoplasm of breast: Secondary | ICD-10-CM | POA: Diagnosis not present

## 2023-09-04 ENCOUNTER — Other Ambulatory Visit: Payer: Self-pay | Admitting: Nurse Practitioner

## 2023-09-04 DIAGNOSIS — E039 Hypothyroidism, unspecified: Secondary | ICD-10-CM

## 2023-09-04 NOTE — Telephone Encounter (Signed)
.  Med refill request: Synthroid   Last AEX: 09/20/22 Next AEX: Not scheduled sent message to front for scheduling  Last MMG (if hormonal med) Refill authorized: Please Advise?

## 2023-09-21 ENCOUNTER — Other Ambulatory Visit: Payer: Self-pay | Admitting: Nurse Practitioner

## 2023-09-21 DIAGNOSIS — Z1231 Encounter for screening mammogram for malignant neoplasm of breast: Secondary | ICD-10-CM

## 2023-10-11 ENCOUNTER — Ambulatory Visit
Admission: RE | Admit: 2023-10-11 | Discharge: 2023-10-11 | Disposition: A | Discharge status: Home / Self Care | Source: Ambulatory Visit

## 2023-10-11 DIAGNOSIS — Z1231 Encounter for screening mammogram for malignant neoplasm of breast: Secondary | ICD-10-CM

## 2023-10-23 NOTE — Progress Notes (Unsigned)
 Madison [name] [facility] Apr 11, 1970 [id number]   History:  53 y.o. G0 presents for annual exam. Postmenopausal. LMP July 2024. Complains of hot flashes, brain fog and increased anxiety. 1994 CIN-1, subsequent normal until 08/2017 which showed ASCUS with negative HPV. History of infertility, Vitamin D  deficiency, and hypothyroidism. Positive ANA with no symptoms, so no further workup was recommended. Normal mammogram history.   Gynecologic History Patient's last menstrual period was 08/07/2022 (exact date).   Contraception: none Sexually active: Yes  Health maintenance Last Pap: 09/20/2022. Results were: Normal neg HPV Last mammogram: 10/11/2023. Results were: Normal Last Colonoscopy: Never Last Dexa: Not indicated     10/24/2023    8:02 AM  Depression screen PHQ 2/9  Decreased Interest 0  Down, Depressed, Hopeless 0  PHQ - 2 Score 0     Past medical history, past surgical history, family history and social history were all reviewed and documented in the EPIC chart. 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 3 labs.   ROS:  A ROS was performed and pertinent positives and negatives are included.  Exam:  Vitals:   10/24/23 0759  BP: 110/70  Pulse: 72  Weight: 128 lb (58.1 kg)  Height: 5' 3.75 (1.619 m)      Body mass index is 22.14 kg/m.  General appearance:  Normal Thyroid :  Symmetrical, normal in size, without palpable masses or nodularity. Respiratory  Auscultation:  Clear without wheezing or rhonchi Cardiovascular  Auscultation:  Regular rate. Murmur  Edema/varicosities:  Not grossly evident Abdominal  Soft,nontender, without masses, guarding or rebound.  Liver/spleen:  No organomegaly noted  Hernia:  None appreciated  Skin  Inspection:  Grossly normal   Breasts: Examined lying and sitting.   Right: Without masses, retractions, discharge or axillary adenopathy.   Left: Without masses, retractions, discharge or axillary  adenopathy. Pelvic: External genitalia:  no lesions              Urethra:  normal appearing urethra with no masses, tenderness or lesions              Bartholins and Skenes: normal                 Vagina: normal appearing vagina with normal color and discharge, no lesions              Cervix: no lesions Bimanual Exam:  Uterus:  no masses or tenderness              Adnexa: no mass, fullness, tenderness              Rectovaginal: Deferred              Anus:  normal, no lesions  Madison [name], CMA present as chaperone.   Assessment/Plan:  53 y.o. G0 for annual exam.   Well female exam with routine gynecological exam - Plan: CBC with Differential/Platelet, Comprehensive metabolic panel, lipid panel. Education provided on SBEs, importance of preventative screenings, current guidelines, high calcium diet, regular exercise, and multivitamin daily.   Screening for lipid disorders - Plan: Lipid panel  Postmenopausal hormone therapy - Plan: estradiol (CLIMARA) 0.025 mg/24hr patch weekly, progesterone (PROMETRIUM) 100 MG capsule nightly. Educated on risks and benefits of use.   Hypothyroidism, unspecified type - Plan: TSH. Takes name brand Synthroid .   Vitamin D  deficiency - Plan: VITAMIN D  25 Hydroxy (Vit-D Deficiency, Fractures). Taking multivitamin.   Screening for cervical cancer - 1994 CIN-1, subsequent normal until 08/2017 which  showed ASCUS with negative HPV. Will repeat at 5-year interval per guidelines.   Screening for breast cancer - Normal mammogram history.  Continue annual screenings.  Normal breast exam today.  Screening for colon cancer - Discussed current guidelines and importance of preventative screenings. Plans to schedule colonoscopy.   Return in about 4 weeks (around 11/21/2023) for Med follow up, 1 year annual.      Madison [name] Shutter [facility], 8:27 AM 10/24/2023

## 2023-10-24 ENCOUNTER — Encounter: Payer: Self-pay | Admitting: Nurse Practitioner

## 2023-10-24 ENCOUNTER — Ambulatory Visit (INDEPENDENT_AMBULATORY_CARE_PROVIDER_SITE_OTHER): Admitting: Nurse Practitioner

## 2023-10-24 VITALS — BP 110/70 | HR 72 | Ht 63.75 in | Wt 128.0 lb

## 2023-10-24 DIAGNOSIS — Z1331 Encounter for screening for depression: Secondary | ICD-10-CM

## 2023-10-24 DIAGNOSIS — Z79890 Hormone replacement therapy (postmenopausal): Secondary | ICD-10-CM

## 2023-10-24 DIAGNOSIS — Z13220 Encounter for screening for lipoid disorders: Secondary | ICD-10-CM | POA: Diagnosis not present

## 2023-10-24 DIAGNOSIS — E039 Hypothyroidism, unspecified: Secondary | ICD-10-CM

## 2023-10-24 DIAGNOSIS — E559 Vitamin D deficiency, unspecified: Secondary | ICD-10-CM

## 2023-10-24 DIAGNOSIS — Z01419 Encounter for gynecological examination (general) (routine) without abnormal findings: Secondary | ICD-10-CM

## 2023-10-24 MED ORDER — ESTRADIOL 0.025 MG/24HR TD PTWK: 0.0250 mg | MEDICATED_PATCH | TRANSDERMAL | 1 refills | Status: DC

## 2023-10-24 MED ORDER — PROGESTERONE MICRONIZED 100 MG PO CAPS: 100.0000 mg | ORAL_CAPSULE | Freq: Every day | ORAL | 1 refills | Status: DC

## 2023-10-25 ENCOUNTER — Other Ambulatory Visit: Payer: Self-pay | Admitting: Nurse Practitioner

## 2023-10-25 ENCOUNTER — Ambulatory Visit: Payer: Self-pay | Admitting: Nurse Practitioner

## 2023-10-25 DIAGNOSIS — E039 Hypothyroidism, unspecified: Secondary | ICD-10-CM

## 2023-10-25 LAB — COMPREHENSIVE METABOLIC PANEL WITH GFR
AG Ratio: 1.9 (calc) (ref 1.0–2.5)
ALT: 15 U/L (ref 6–29)
AST: 17 U/L (ref 10–35)
Albumin: 5 g/dL (ref 3.6–5.1)
Alkaline phosphatase (APISO): 53 U/L (ref 37–153)
BUN: 16 mg/dL (ref 7–25)
CO2: 28 mmol/L (ref 20–32)
Calcium: 10.1 mg/dL (ref 8.6–10.4)
Chloride: 101 mmol/L (ref 98–110)
Creat: 0.64 mg/dL (ref 0.50–1.03)
Globulin: 2.6 g/dL (ref 1.9–3.7)
Glucose, Bld: 91 mg/dL (ref 65–99)
Potassium: 4.7 mmol/L (ref 3.5–5.3)
Sodium: 139 mmol/L (ref 135–146)
Total Bilirubin: 0.6 mg/dL (ref 0.2–1.2)
Total Protein: 7.6 g/dL (ref 6.1–8.1)
eGFR: 106 mL/min/1.73m2 (ref >=60)

## 2023-10-25 LAB — CBC WITH DIFFERENTIAL/PLATELET
Absolute Lymphocytes: 1800 {cells}/uL (ref 850–3900)
Absolute Monocytes: 471 {cells}/uL (ref 200–950)
Basophils Absolute: 48 {cells}/uL (ref 0–200)
Basophils Relative: 1.1 %
Eosinophils Absolute: 180 {cells}/uL (ref 15–500)
Eosinophils Relative: 4.1 %
HCT: 41.5 % (ref 35.0–45.0)
Hemoglobin: 13.6 g/dL (ref 11.7–15.5)
MCH: 31.8 pg (ref 27.0–33.0)
MCHC: 32.8 g/dL (ref 32.0–36.0)
MCV: 97 fL (ref 80.0–100.0)
MPV: 10.6 fL (ref 7.5–12.5)
Monocytes Relative: 10.7 %
Neutro Abs: 1901 {cells}/uL (ref 1500–7800)
Neutrophils Relative %: 43.2 %
Platelets: 249 Thousand/uL (ref 140–400)
RBC: 4.28 Million/uL (ref 3.80–5.10)
RDW: 13.7 % (ref 11.0–15.0)
Total Lymphocyte: 40.9 %
WBC: 4.4 Thousand/uL (ref 3.8–10.8)

## 2023-10-25 LAB — LIPID PANEL
Cholesterol: 176 mg/dL (ref <200)
HDL: 70 mg/dL (ref >=50)
LDL Cholesterol (Calc): 92 mg/dL
Non-HDL Cholesterol (Calc): 106 mg/dL (ref <130)
Total CHOL/HDL Ratio: 2.5 (calc) (ref <5.0)
Triglycerides: 49 mg/dL (ref <150)

## 2023-10-25 LAB — TSH: TSH: 3.6 m[IU]/L

## 2023-10-25 LAB — VITAMIN D 25 HYDROXY (VIT D DEFICIENCY, FRACTURES): Vit D, 25-Hydroxy: 37 ng/mL (ref 30–100)

## 2023-10-25 MED ORDER — SYNTHROID 112 MCG PO TABS: 112.0000 ug | ORAL_TABLET | Freq: Every day | ORAL | 3 refills | Status: AC

## 2023-11-29 ENCOUNTER — Encounter: Payer: Self-pay | Admitting: Nurse Practitioner

## 2023-11-29 ENCOUNTER — Ambulatory Visit (INDEPENDENT_AMBULATORY_CARE_PROVIDER_SITE_OTHER): Admitting: Nurse Practitioner

## 2023-11-29 VITALS — BP 114/70 | HR 68 | Resp 16 | Wt 129.0 lb

## 2023-11-29 DIAGNOSIS — Z79890 Hormone replacement therapy (postmenopausal): Secondary | ICD-10-CM | POA: Diagnosis not present

## 2023-11-29 MED ORDER — PROGESTERONE MICRONIZED 100 MG PO CAPS: 100.0000 mg | ORAL_CAPSULE | Freq: Every day | ORAL | 2 refills | Status: DC

## 2023-11-29 MED ORDER — ESTRADIOL 0.0375 MG/24HR TD PTWK: 0.0375 mg | MEDICATED_PATCH | TRANSDERMAL | 2 refills | Status: DC

## 2023-11-29 NOTE — Progress Notes (Signed)
   Acute Office Visit  Subjective:    Patient ID: Madison [name], female    DOB: Jan 26, 1970, 53 y.o.   MRN: [id number]   HPI 53 y.o. presents today for 4-week med follow up. Started HRT for menopausal symptoms of hot flashes, brain fog, sleep disturbance and increased anxiety. Currently on low dose estradiol patch and nightly prometrium. Has noticed much improvement in sleep and hot flashes, but still experiencing neurological symptoms. Has noticed some bloating and ring feels tighter since starting HRT.   Patient's last menstrual period was 08/07/2022 (exact date).    Review of Systems  Constitutional: Negative.   Psychiatric/Behavioral:  Positive for decreased concentration and sleep disturbance. The patient is nervous/anxious.        Objective:    Physical Exam Constitutional:      Appearance: Normal appearance.     BP 114/70   Pulse 68   Resp 16   Wt 129 lb (58.5 kg)   LMP 08/07/2022 (Exact Date)   BMI 22.32 kg/m  Wt Readings from Last 3 Encounters:  11/29/23 129 lb (58.5 kg)  10/24/23 128 lb (58.1 kg)  09/20/22 126 lb 6.4 oz (57.3 kg)        Assessment & Plan:   Problem List Items Addressed This Visit   None Visit Diagnoses       Postmenopausal hormone therapy    -  Primary   Relevant Medications   estradiol (CLIMARA) 0.0375 mg/24hr patch   progesterone (PROMETRIUM) 100 MG capsule      Plan: Will increase estradiol patch. Continue nightly Prometrium. Discussed fluid retention as potential side effect of HRT. Likely to improve. Will monitor.   Return if symptoms worsen or fail to improve.    Madison DELENA Shutter DNP, 8:46 AM 11/29/2023

## 2023-12-12 ENCOUNTER — Encounter: Payer: Self-pay | Admitting: Nurse Practitioner

## 2024-01-17 ENCOUNTER — Encounter: Payer: Self-pay | Admitting: Pediatrics

## 2024-01-29 ENCOUNTER — Ambulatory Visit

## 2024-01-29 VITALS — Ht 63.0 in | Wt 124.0 lb

## 2024-01-29 DIAGNOSIS — Z1211 Encounter for screening for malignant neoplasm of colon: Secondary | ICD-10-CM

## 2024-01-29 MED ORDER — NA SULFATE-K SULFATE-MG SULF 17.5-3.13-1.6 GM/177ML PO SOLN: 1.0000 | Freq: Once | ORAL | 0 refills | Status: AC

## 2024-01-29 NOTE — Progress Notes (Signed)
Pre visit completed via phone call; Patient verified name, DOB, and address; No egg or soy allergy known to patient;  No issues known to pt with past sedation with any surgeries or procedures; Patient denies ever being told they had issues or difficulty with intubation;  No FH of Malignant Hyperthermia; Pt is not on diet pills; Pt is not on home 02;  Pt is not on blood thinners;  Pt denies issues with constipation;  No A fib or A flutter; Have any cardiac testing pending--NO Insurance verified during PV appt--- BCBS Pt can ambulate without assistance;  Pt denies use of chewing tobacco Discussed diabetic/weight loss medication holds; Discussed NSAID holds; Checked BMI to be less than 50; Pt instructed to use Singlecare.com or GoodRx for a price reduction on prep  Patient's chart reviewed by Cathlyn Parsons CNRA prior to previsit and patient appropriate for the [facility].  Pre visit completed and red dot placed by patient's name on their procedure day (on provider's schedule).    Instructions sent to MyChart per patient request;

## 2024-01-30 ENCOUNTER — Encounter: Payer: Self-pay | Admitting: Pediatrics

## 2024-02-10 NOTE — Progress Notes (Unsigned)
 [facility] History and Physical   Primary Care Physician:  Trudy Elodia PARAS, PA-C   Reason for Procedure:  Colorectal cancer screening  Plan:    Colonoscopy   The patient was provided an opportunity to ask questions and all were answered. The patient agreed with the plan.   HPI: Madison [name] is a 54 y.o. female undergoing colonoscopy for colorectal cancer screening.  This is the patient's first colonoscopy.  Family history of colon polyps-mother, sister, maternal grandmother Family history of colon cancer-paternal grandfather  Patient denies current symptoms of rectal bleeding or change in bowel habits at the time of today's exam   Past Medical History:  Diagnosis Date   Acne    on back   ASCUS of cervix with negative high risk HPV 08/2017   CIN I (cervical intraepithelial neoplasia I) 1994   Heart murmur    Hypothyroid    on meds    Past Surgical History:  Procedure Laterality Date   COLPOSCOPY  2001   WISDOM TOOTH EXTRACTION      Prior to Admission medications  Medication Sig Start Date End Date Taking? Authorizing Provider  Multiple Vitamin (MULTIVITAMIN PO) Take by mouth. Patient taking differently: Take 1 tablet by mouth daily at 6 (six) AM.    [provider]  progesterone  (PROMETRIUM ) 100 MG capsule Take 1 capsule (100 mg total) by mouth daily. Patient not taking: Reported on 01/29/2024 11/29/23   Prentiss Annabella LABOR, NP  SYNTHROID  112 MCG tablet Take 1 tablet (112 mcg total) by mouth daily before breakfast. 10/25/23   Prentiss Annabella LABOR, NP    Current Outpatient Medications  Medication Sig Dispense Refill   Multiple Vitamin (MULTIVITAMIN PO) Take by mouth. (Patient taking differently: Take 1 tablet by mouth daily at 6 (six) AM.)     progesterone  (PROMETRIUM ) 100 MG capsule Take 1 capsule (100 mg total) by mouth daily. (Patient not taking: Reported on 01/29/2024) 90 capsule 2   SYNTHROID  112 MCG tablet Take 1 tablet (112  mcg total) by mouth daily before breakfast. 90 tablet 3   No current facility-administered medications for this visit.    Allergies as of 02/12/2024   (No Known Allergies)    Family History  Problem Relation Age of Onset   Colon polyps Mother 101   Parkinson's disease Mother    Lung cancer Father 51   Colon polyps Sister 23   Esophageal cancer Paternal Uncle 74       heavy ETOH, smoker heavy   Colon polyps Maternal Grandmother    Hypertension Maternal Grandmother    Heart disease Maternal Grandmother    Hypertension Maternal Grandfather    Heart disease Maternal Grandfather    Hypertension Paternal Grandmother    Hypertension Paternal Grandfather    Colon cancer Paternal Grandfather 66   Breast cancer Neg Hx    Rectal cancer Neg Hx    Stomach cancer Neg Hx     Social History   Socioeconomic History   Marital status: Married    Spouse name: Not on file   Number of children: Not on file   Years of education: Not on file   Highest education level: Not on file  Occupational History   Not on file  Tobacco Use   Smoking status: Former   Smokeless tobacco: Never  Vaping Use   Vaping status: Never Used  Substance and Sexual Activity   Alcohol use: Yes    Alcohol/week: 10.0 standard drinks of alcohol  Types: 10 Standard drinks or equivalent per week   Drug use: No   Sexual activity: Yes    Partners: Male    Birth control/protection: Post-menopausal    Comment: 1st intercourse 54 yo-More than 5 partners  Other Topics Concern   Not on file  Social History Narrative   Not on file   Social Drivers of Health   Tobacco Use: Medium Risk (01/29/2024)   Patient History    Smoking Tobacco Use: Former    Smokeless Tobacco Use: Never    Passive Exposure: Not on Actuary Strain: Not on file  Food Insecurity: Not on file  Transportation Needs: Not on file  Physical Activity: Not on file  Stress: Not on file  Social Connections: Unknown (05/22/2021)    Received from [facility]   Social Network    Social Network: Not on file  Intimate Partner Violence: Unknown (04/13/2021)   Received from [facility]   HITS    Physically Hurt: Not on file    Insult or Talk Down To: Not on file    Threaten Physical Harm: Not on file    Scream or Curse: Not on file  Depression (PHQ2-9): Low Risk (10/24/2023)   Depression (PHQ2-9)    PHQ-2 Score: 0  Alcohol Screen: Not on file  Housing: Not on file  Utilities: Not on file  Health Literacy: Not on file    Review of Systems:  All other review of systems negative except as mentioned in the HPI.  Physical Exam: Vital signs LMP 08/07/2022   General:   Alert,  Well-developed, well-nourished, pleasant and cooperative in NAD Airway:  Mallampati  Lungs:  Clear throughout to auscultation.   Heart:  Regular rate and rhythm; no murmurs, clicks, rubs,  or gallops. Abdomen:  Soft, nontender and nondistended. Normal bowel sounds.   Neuro/Psych:  Normal mood and affect. A and O x 3  Inocente Hausen, MD [facility]

## 2024-02-11 ENCOUNTER — Encounter: Payer: Self-pay | Admitting: Pediatrics

## 2024-02-12 ENCOUNTER — Ambulatory Visit: Admitting: Pediatrics

## 2024-02-12 ENCOUNTER — Encounter: Payer: Self-pay | Admitting: Pediatrics

## 2024-02-12 VITALS — BP 92/50 | HR 62 | Temp 98.4°F | Resp 17 | Ht 63.0 in | Wt 124.0 lb

## 2024-02-12 DIAGNOSIS — Z83719 Family history of colon polyps, unspecified: Secondary | ICD-10-CM

## 2024-02-12 DIAGNOSIS — Z800 Family history of malignant neoplasm of digestive organs: Secondary | ICD-10-CM

## 2024-02-12 DIAGNOSIS — Z1211 Encounter for screening for malignant neoplasm of colon: Secondary | ICD-10-CM

## 2024-02-12 DIAGNOSIS — D122 Benign neoplasm of ascending colon: Secondary | ICD-10-CM

## 2024-02-12 MED ORDER — SODIUM CHLORIDE 0.9 % IV SOLN: 500.0000 mL | Freq: Once | INTRAVENOUS | Status: DC

## 2024-02-12 NOTE — Progress Notes (Signed)
 Sedate, gd SR, tolerated procedure well, VSS, report to RN

## 2024-02-12 NOTE — Patient Instructions (Signed)
 Discharge instructions given. Handout on polyps. Resume previous medications. YOU HAD AN ENDOSCOPIC PROCEDURE TODAY AT THE [facility]:   Refer to the procedure report that was given to you for any specific questions about what was found during the examination.  If the procedure report does not answer your questions, please call your gastroenterologist to clarify.  If you requested that your care partner not be given the details of your procedure findings, then the procedure report has been included in a sealed envelope for you to review at your convenience later.  YOU SHOULD EXPECT: Some feelings of bloating in the abdomen. Passage of more gas than usual.  Walking can help get rid of the air that was put into your GI tract during the procedure and reduce the bloating. If you had a lower endoscopy (such as a colonoscopy or flexible sigmoidoscopy) you may notice spotting of blood in your stool or on the toilet paper. If you underwent a bowel prep for your procedure, you may not have a normal bowel movement for a few days.  Please Note:  You might notice some irritation and congestion in your nose or some drainage.  This is from the oxygen used during your procedure.  There is no need for concern and it should clear up in a day or so.  SYMPTOMS TO REPORT IMMEDIATELY:  Following lower endoscopy (colonoscopy or flexible sigmoidoscopy):  Excessive amounts of blood in the stool  Significant tenderness or worsening of abdominal pains  Swelling of the abdomen that is new, acute  Fever of 100F or higher   For urgent or emergent issues, a gastroenterologist can be reached at any hour by calling [phone]. Do not use MyChart messaging for urgent concerns.    DIET:  We do recommend a small meal at first, but then you may proceed to your regular diet.  Drink plenty of fluids but you should avoid alcoholic beverages for 24 hours.  ACTIVITY:  You should plan to take it easy for the rest  of today and you should NOT DRIVE or use heavy machinery until tomorrow (because of the sedation medicines used during the test).    FOLLOW UP: Our staff will call the number listed on your records the next business day following your procedure.  We will call around 7:15- 8:00 am to check on you and address any questions or concerns that you may have regarding the information given to you following your procedure. If we do not reach you, we will leave a message.     If any biopsies were taken you will be contacted by phone or by letter within the next 1-3 weeks.  Please call us at [phone] if you have not heard about the biopsies in 3 weeks.    SIGNATURES/CONFIDENTIALITY: You and/or your care partner have signed paperwork which will be entered into your electronic medical record.  These signatures attest to the fact that that the information above on your After Visit Summary has been reviewed and is understood.  Full responsibility of the confidentiality of this discharge information lies with you and/or your care-partner.

## 2024-02-12 NOTE — Op Note (Signed)
 [facility] Patient Name: Madison [name] Procedure Date: 02/12/2024 11:53 AM MRN: [id number] Endoscopist: Inocente Hausen , MD, [id number] Age: 54 Referring MD:  Date of Birth: Jun 05, 1970 Gender: Female Account #: [id number] Procedure:                Colonoscopy Indications:              Colon cancer screening in patient at increased                            risk: Family history of colon polyps in multiple                            1st-degree relatives, This is the patient's first                            colonoscopy, Family history of colon cancer in a                            distant relative Medicines:                Monitored Anesthesia Care Procedure:                Pre-Anesthesia Assessment:                           - Prior to the procedure, a History and Physical                            was performed, and patient medications and                            allergies were reviewed. The patient's tolerance of                            previous anesthesia was also reviewed. The risks                            and benefits of the procedure and the sedation                            options and risks were discussed with the patient.                            All questions were answered, and informed consent                            was obtained. Prior Anticoagulants: The patient has                            taken no anticoagulant or antiplatelet agents. ASA                            Grade Assessment: I - A normal, healthy patient.  After reviewing the risks and benefits, the patient                            was deemed in satisfactory condition to undergo the                            procedure.                           After obtaining informed consent, the colonoscope                            was passed under direct vision. Throughout the                            procedure, the patient's blood pressure, pulse, and                             oxygen saturations were monitored continuously. The                            [id number]-HQ190L Colonoscope [id number] was introduced                            through the anus and advanced to the cecum,                            identified by appendiceal orifice and ileocecal                            valve. The colonoscopy was performed without                            difficulty. The patient tolerated the procedure                            well. The quality of the bowel preparation was                            good. The ileocecal valve, appendiceal orifice, and                            rectum were photographed. Scope In: 12:03:54 PM Scope Out: 12:25:26 PM Scope Withdrawal Time: 0 hours 15 minutes 12 seconds  Total Procedure Duration: 0 hours 21 minutes 32 seconds  Findings:                 The perianal and digital rectal examinations were                            normal. Pertinent negatives include normal                            sphincter tone and no palpable rectal lesions.  A 10 mm polyp was found in the proximal sigmoid                            colon. The polyp was sessile. The polyp was removed                            with a hot snare. Resection and retrieval were                            complete.                           A 4 mm polyp was found in the proximal ascending                            colon. The polyp was sessile. The polyp was removed                            with a cold biopsy forceps. Resection and retrieval                            were complete.                           The retroflexed view of the distal rectum and anal                            verge was normal and showed no anal or rectal                            abnormalities. Complications:            No immediate complications. Estimated blood loss:                            Minimal. Estimated Blood Loss:     Estimated blood loss was  minimal. Impression:               - One 10 mm polyp in the proximal sigmoid colon,                            removed with a hot snare. Resected and retrieved.                           - One 4 mm polyp in the proximal ascending colon,                            removed with a cold biopsy forceps. Resected and                            retrieved.                           - The distal rectum and anal verge are normal on  retroflexion view. Recommendation:           - Discharge patient to home (ambulatory).                           - Await pathology results.                           - Repeat colonoscopy for surveillance based on                            pathology results.                           - The findings and recommendations were discussed                            with the patient's family.                           - Patient has a contact number available for                            emergencies. The signs and symptoms of potential                            delayed complications were discussed with the                            patient. Return to normal activities tomorrow.                            Written discharge instructions were provided to the                            patient. Inocente Hausen, MD 02/12/2024 12:30:20 PM This report has been signed electronically.

## 2024-02-12 NOTE — Progress Notes (Signed)
 Called to room to assist during endoscopic procedure.  Patient ID and intended procedure confirmed with present staff. Received instructions for my participation in the procedure from the performing physician.

## 2024-02-12 NOTE — Progress Notes (Signed)
No changes since previsit. 

## 2024-02-13 ENCOUNTER — Telehealth: Payer: Self-pay

## 2024-02-13 NOTE — Telephone Encounter (Signed)
" °  Follow up Call-     02/12/2024   11:43 AM 02/12/2024   11:36 AM  Call back number  Post procedure Call Back phone  # [phone]   Permission to leave phone message  Yes     Patient questions:  Do you have a fever, pain , or abdominal swelling? No. Pain Score  0 *  Have you tolerated food without any problems? Yes.    Have you been able to return to your normal activities? Yes.    Do you have any questions about your discharge instructions: Diet   No. Medications  No. Follow up visit  No.  Do you have questions or concerns about your Care? No.  Actions: * If pain score is 4 or above: No action needed, pain <4.   "

## 2024-10-30 ENCOUNTER — Ambulatory Visit: Admitting: Nurse Practitioner
